# Patient Record
Sex: Female | Born: 1935 | Race: White | Hispanic: No | Smoking: Never smoker
Health system: Southern US, Community
[De-identification: ages and names within clinical notes are randomized; demographics above are authoritative.]

## PROBLEM LIST (undated history)

## (undated) DIAGNOSIS — F0390 Unspecified dementia without behavioral disturbance: Secondary | ICD-10-CM

## (undated) DIAGNOSIS — C801 Malignant (primary) neoplasm, unspecified: Secondary | ICD-10-CM

## (undated) DIAGNOSIS — H9190 Unspecified hearing loss, unspecified ear: Secondary | ICD-10-CM

## (undated) DIAGNOSIS — F419 Anxiety disorder, unspecified: Secondary | ICD-10-CM

## (undated) DIAGNOSIS — I639 Cerebral infarction, unspecified: Secondary | ICD-10-CM

## (undated) DIAGNOSIS — F0391 Unspecified dementia with behavioral disturbance: Secondary | ICD-10-CM

## (undated) DIAGNOSIS — G4700 Insomnia, unspecified: Secondary | ICD-10-CM

## (undated) HISTORY — DX: Unspecified dementia with behavioral disturbance: F03.91

## (undated) HISTORY — PX: NO PAST SURGERIES: SHX2092

## (undated) HISTORY — DX: Insomnia, unspecified: G47.00

## (undated) HISTORY — DX: Unspecified hearing loss, unspecified ear: H91.90

---

## 1997-12-10 ENCOUNTER — Other Ambulatory Visit: Admission: RE | Admit: 1997-12-10 | Discharge: 1997-12-10 | Payer: Self-pay | Admitting: *Deleted

## 1998-09-25 ENCOUNTER — Inpatient Hospital Stay (HOSPITAL_COMMUNITY): Admission: EM | Admit: 1998-09-25 | Discharge: 1998-09-26 | Payer: Self-pay | Admitting: Emergency Medicine

## 1999-01-07 ENCOUNTER — Other Ambulatory Visit: Admission: RE | Admit: 1999-01-07 | Discharge: 1999-01-07 | Payer: Self-pay | Admitting: *Deleted

## 1999-10-27 ENCOUNTER — Encounter: Admission: RE | Admit: 1999-10-27 | Discharge: 1999-10-27 | Payer: Self-pay | Admitting: *Deleted

## 1999-10-27 ENCOUNTER — Encounter: Payer: Self-pay | Admitting: *Deleted

## 2000-01-26 ENCOUNTER — Other Ambulatory Visit: Admission: RE | Admit: 2000-01-26 | Discharge: 2000-01-26 | Payer: Self-pay | Admitting: *Deleted

## 2000-11-06 ENCOUNTER — Encounter: Payer: Self-pay | Admitting: Obstetrics and Gynecology

## 2000-11-06 ENCOUNTER — Encounter: Admission: RE | Admit: 2000-11-06 | Discharge: 2000-11-06 | Payer: Self-pay | Admitting: Obstetrics and Gynecology

## 2001-11-13 ENCOUNTER — Encounter: Payer: Self-pay | Admitting: *Deleted

## 2001-11-13 ENCOUNTER — Encounter: Admission: RE | Admit: 2001-11-13 | Discharge: 2001-11-13 | Payer: Self-pay | Admitting: *Deleted

## 2002-11-18 ENCOUNTER — Encounter: Payer: Self-pay | Admitting: *Deleted

## 2002-11-18 ENCOUNTER — Encounter: Admission: RE | Admit: 2002-11-18 | Discharge: 2002-11-18 | Payer: Self-pay | Admitting: *Deleted

## 2003-12-02 ENCOUNTER — Encounter: Admission: RE | Admit: 2003-12-02 | Discharge: 2003-12-02 | Payer: Self-pay | Admitting: *Deleted

## 2004-07-16 ENCOUNTER — Encounter: Admission: RE | Admit: 2004-07-16 | Discharge: 2004-07-16 | Payer: Self-pay | Admitting: Family Medicine

## 2007-11-25 ENCOUNTER — Emergency Department (HOSPITAL_COMMUNITY): Admission: EM | Admit: 2007-11-25 | Discharge: 2007-11-25 | Payer: Self-pay | Admitting: Emergency Medicine

## 2011-05-30 ENCOUNTER — Encounter (HOSPITAL_COMMUNITY): Payer: Self-pay | Admitting: Emergency Medicine

## 2011-05-30 ENCOUNTER — Emergency Department (HOSPITAL_COMMUNITY)
Admission: EM | Admit: 2011-05-30 | Discharge: 2011-05-30 | Disposition: A | Discharge status: Home / Self Care | Payer: Medicare Other | Attending: Emergency Medicine | Admitting: Emergency Medicine

## 2011-05-30 ENCOUNTER — Other Ambulatory Visit: Payer: Self-pay

## 2011-05-30 ENCOUNTER — Emergency Department (HOSPITAL_COMMUNITY): Payer: Medicare Other

## 2011-05-30 DIAGNOSIS — S01511A Laceration without foreign body of lip, initial encounter: Secondary | ICD-10-CM

## 2011-05-30 DIAGNOSIS — S01501A Unspecified open wound of lip, initial encounter: Secondary | ICD-10-CM | POA: Insufficient documentation

## 2011-05-30 DIAGNOSIS — R220 Localized swelling, mass and lump, head: Secondary | ICD-10-CM | POA: Insufficient documentation

## 2011-05-30 DIAGNOSIS — F0390 Unspecified dementia without behavioral disturbance: Secondary | ICD-10-CM | POA: Insufficient documentation

## 2011-05-30 DIAGNOSIS — Z79899 Other long term (current) drug therapy: Secondary | ICD-10-CM | POA: Insufficient documentation

## 2011-05-30 DIAGNOSIS — W19XXXA Unspecified fall, initial encounter: Secondary | ICD-10-CM | POA: Insufficient documentation

## 2011-05-30 DIAGNOSIS — S022XXA Fracture of nasal bones, initial encounter for closed fracture: Secondary | ICD-10-CM | POA: Insufficient documentation

## 2011-05-30 HISTORY — DX: Unspecified dementia, unspecified severity, without behavioral disturbance, psychotic disturbance, mood disturbance, and anxiety: F03.90

## 2011-05-30 LAB — CBC
HCT: 39.1 % (ref 36.0–46.0)
Hemoglobin: 13.1 g/dL (ref 12.0–15.0)
MCH: 31.7 pg (ref 26.0–34.0)
MCHC: 33.5 g/dL (ref 30.0–36.0)
RDW: 13.7 % (ref 11.5–15.5)

## 2011-05-30 LAB — CARDIAC PANEL(CRET KIN+CKTOT+MB+TROPI): Total CK: 117 U/L (ref 7–177)

## 2011-05-30 LAB — COMPREHENSIVE METABOLIC PANEL
Albumin: 3.5 g/dL (ref 3.5–5.2)
BUN: 15 mg/dL (ref 6–23)
Creatinine, Ser: 0.68 mg/dL (ref 0.50–1.10)
Total Protein: 6.4 g/dL (ref 6.0–8.3)

## 2011-05-30 LAB — URINALYSIS, ROUTINE W REFLEX MICROSCOPIC
Glucose, UA: NEGATIVE mg/dL
Leukocytes, UA: NEGATIVE
Specific Gravity, Urine: 1.024 (ref 1.005–1.030)
pH: 5.5 (ref 5.0–8.0)

## 2011-05-30 LAB — PROTIME-INR
INR: 0.99 (ref 0.00–1.49)
Prothrombin Time: 13.3 seconds (ref 11.6–15.2)

## 2011-05-30 LAB — DIFFERENTIAL
Basophils Relative: 0 % (ref 0–1)
Eosinophils Absolute: 0 10*3/uL (ref 0.0–0.7)
Monocytes Absolute: 0.6 10*3/uL (ref 0.1–1.0)
Monocytes Relative: 6 % (ref 3–12)
Neutrophils Relative %: 83 % — ABNORMAL HIGH (ref 43–77)

## 2011-05-30 LAB — APTT: aPTT: 33 seconds (ref 24–37)

## 2011-05-30 MED ORDER — TRAMADOL HCL 50 MG PO TABS: 50.0000 mg | ORAL_TABLET | Freq: Four times a day (QID) | ORAL | Status: AC | PRN

## 2011-05-30 MED ORDER — BACITRACIN-NEOMYCIN-POLYMYXIN 400-5-5000 EX OINT
TOPICAL_OINTMENT | CUTANEOUS | Status: AC
  Administered 2011-05-30: 1
  Filled 2011-05-30: qty 1

## 2011-05-30 MED ORDER — TETANUS-DIPHTH-ACELL PERTUSSIS 5-2.5-18.5 LF-MCG/0.5 IM SUSP
0.5000 mL | Freq: Once | INTRAMUSCULAR | Status: AC
  Administered 2011-05-30: 0.5 mL via INTRAMUSCULAR
  Filled 2011-05-30: qty 0.5

## 2011-05-30 NOTE — ED Provider Notes (Signed)
Pt was sutured by me. Pt with fall while walking. Laceration to the lip, several abrasions.   LACERATION REPAIR Performed by: Lottie Mussel Authorized by: Jaynie Crumble A Consent: Verbal consent obtained. Risks and benefits: risks, benefits and alternatives were discussed Consent given by: patient Patient identity confirmed: provided demographic data Prepped and Draped in normal sterile fashion Wound explored  Laceration Location: upper lip, stellae lac Laceration Length: 1cm  No Foreign Bodies seen or palpated  Anesthesia: local infiltration  Local anesthetic: lidocaine 2% wo epinephrine  Anesthetic total: 1ml  Irrigation method: syringe Amount of cleaning: standard  Skin closure: vicril rapid  Number of sutures: 3  Technique: 3  Patient tolerance: Patient tolerated the procedure well with no immediate complications.   Lottie Mussel, [place] 05/30/11 2146

## 2011-05-30 NOTE — ED Notes (Signed)
Pt ambulated to restroom with minimal assist.

## 2011-05-30 NOTE — Discharge Instructions (Signed)
Absorbable Suture Repair Absorbable sutures (stitches) hold skin together so you can heal. Keep skin wounds clean and dry for the next 2 to 3 days. Then, you may gently wash your wound and dress it with an antibiotic ointment as recommended. As your wound begins to heal, the sutures are no longer needed, and they typically begin to fall off. This will take 7 to 10 days. After 10 days, if your sutures are loose, you can remove them by wiping with a clean gauze pad or a cotton ball. Do not pull your sutures out. They should wipe away easily. If after 10 days they do not easily wipe away, have your caregiver take them out. Absorbable sutures may be used deep in a wound to help hold it together. If these stitches are below the skin, the body will absorb them completely in 3 to 4 weeks.  You may need a tetanus shot if:  You cannot remember when you had your last tetanus shot.   You have never had a tetanus shot.  If you get a tetanus shot, your arm may swell, get red, and feel warm to the touch. This is common and not a problem. If you need a tetanus shot and you choose not to have one, there is a rare chance of getting tetanus. Sickness from tetanus can be serious. SEEK IMMEDIATE MEDICAL CARE IF:  You have redness in the wound area.   The wound area feels hot to the touch.   You develop swelling in the wound area.   You develop pain.   There is fluid drainage from the wound.  Document Released: 04/21/2004 Document Revised: 03/03/2011 Document Reviewed: 08/03/2010 Exit[employer] Patient Information 2012 [employer]. 

## 2011-05-30 NOTE — ED Notes (Signed)
Pt fell while walking, laceration to lip and nose

## 2011-05-30 NOTE — ED Provider Notes (Signed)
History     CSN: [id number]  Arrival date & time 05/30/11  1813   First MD Initiated Contact with Patient 05/30/11 1922      Chief Complaint  Patient presents with  . Fall    (Consider location/radiation/quality/duration/timing/severity/associated sxs/prior treatment) HPI Pt had unwitnessed fall from standing striking her head sustaining small laceration to her upper lip and abrasion to her nose. Denies LOC, neck pain, CP, weakness, abd pain, N/V. No mental status change  Past Medical History  Diagnosis Date  . Dementia     History reviewed. No pertinent past surgical history.  No family history on file.  History  Substance Use Topics  . Smoking status: Not on file  . Smokeless tobacco: Not on file  . Alcohol Use:     OB History    Grav Para Term Preterm Abortions TAB SAB Ect Mult Living                  Review of Systems  Constitutional: Negative for fever and chills.  HENT: Positive for facial swelling. Negative for neck pain and neck stiffness.   Eyes: Negative for pain and visual disturbance.  Respiratory: Negative for cough, shortness of breath and wheezing.   Cardiovascular: Negative for chest pain and leg swelling.  Gastrointestinal: Negative for nausea, vomiting, abdominal pain and diarrhea.  Musculoskeletal: Negative for back pain and joint swelling.  Skin: Positive for wound. Negative for color change, pallor and rash.  Neurological: Negative for dizziness, syncope, weakness, numbness and headaches.    Allergies  Review of patient's allergies indicates no known allergies.  Home Medications   Current Outpatient Rx  Name Route Sig Dispense Refill  . DONEPEZIL HCL 10 MG PO TABS Oral Take 10 mg by mouth daily.    Marland Kitchen RISPERIDONE 0.25 MG PO TABS Oral Take 0.25 mg by mouth 2 (two) times daily.    . SERTRALINE HCL 100 MG PO TABS Oral Take 100 mg by mouth daily.    . TRAMADOL HCL 50 MG PO TABS Oral Take 1 tablet (50 mg total) by mouth every 6 (six) hours as  needed for pain. 15 tablet 0    BP 127/59  Pulse 78  Temp(Src) 98.3 F (36.8 C) (Oral)  Resp 22  SpO2 100%  Physical Exam  Nursing note and vitals reviewed. Constitutional: She is oriented to person, place, and time. She appears well-developed and well-nourished. No distress.  HENT:  Head: Normocephalic.  Mouth/Throat: Oropharynx is clear and moist.       Abrasions to nose with swelling and tenderness present. Small 1 cm irregular laceration to top lip.   Eyes: EOM are normal. Pupils are equal, round, and reactive to light.  Neck: Normal range of motion. Neck supple.       No post cervical midline tenderness. FROM  Cardiovascular: Normal rate and regular rhythm.   Pulmonary/Chest: Effort normal and breath sounds normal. No respiratory distress. She has no wheezes. She has no rales.  Abdominal: Soft. Bowel sounds are normal. There is no tenderness. There is no rebound and no guarding.  Musculoskeletal: Normal range of motion. She exhibits no edema and no tenderness.  Neurological: She is alert and oriented to person, place, and time.       5/5 motor, sensation intact  Skin: Skin is warm and dry. No rash noted. No erythema.  Psychiatric: She has a normal mood and affect. Her behavior is normal.    ED Course  Procedures (including critical care time)  Labs Reviewed  DIFFERENTIAL - Abnormal; Notable for the following:    Neutrophils Relative 83 (*)    Lymphocytes Relative 11 (*)    All other components within normal limits  COMPREHENSIVE METABOLIC PANEL - Abnormal; Notable for the following:    Glucose, Bld 122 (*)    Total Bilirubin 0.2 (*)    GFR calc non Af Amer 78 (*)    All other components within normal limits  CARDIAC PANEL(CRET KIN+CKTOT+MB+TROPI) - Abnormal; Notable for the following:    CK, MB 5.7 (*)    Relative Index 4.9 (*)    All other components within normal limits  CBC  URINALYSIS, ROUTINE W REFLEX MICROSCOPIC  PROTIME-INR  APTT  LAB REPORT - SCANNED    Ct Head Wo Contrast  05/30/2011  *RADIOLOGY REPORT*  Clinical Data:  Fall.  Facial injuries.  CT HEAD WITHOUT CONTRAST CT MAXILLOFACIAL WITHOUT CONTRAST  Technique:  Multidetector CT imaging of the head and maxillofacial structures were performed using the standard protocol without intravenous contrast. Multiplanar CT image reconstructions of the maxillofacial structures were also generated.  Comparison:   None.  CT HEAD  Findings: There is no evidence for acute hemorrhage, hydrocephalus, mass lesion, or abnormal extra-axial fluid collection.  No definite CT evidence for acute infarction.  Diffuse loss of parenchymal volume is consistent with atrophy. Patchy low attenuation in the deep hemispheric and periventricular white matter is nonspecific, but likely reflects chronic microvascular ischemic demyelination.  The visualized paranasal sinuses and mastoid air cells are clear. No evidence for skull fracture.  IMPRESSION: No acute intracranial abnormality.  CT MAXILLOFACIAL  Findings:   The mandible is intact.  The temporomandibular joints are located bilaterally.  There are degenerative changes in the temporomandibular joints bilaterally.  Minimally displaced nasal bone fractures are age indeterminate.  The zygomatic arches are intact.  There is no medial or inferior orbital wall fracture.  No evidence for maxillary sinus fracture.  The globes are symmetric in size and both retaining spherical shape.  Intraorbital fat is preserved bilaterally.  IMPRESSION: Minimally less for displaced nasal bone fractures are age indeterminate in appearance.  Otherwise no facial bone fracture is evident.  Original Report Authenticated By: ERIC A. MANSELL, M.D.   Ct Maxillofacial Wo Cm  05/30/2011  *RADIOLOGY REPORT*  Clinical Data:  Fall.  Facial injuries.  CT HEAD WITHOUT CONTRAST CT MAXILLOFACIAL WITHOUT CONTRAST  Technique:  Multidetector CT imaging of the head and maxillofacial structures were performed using the standard  protocol without intravenous contrast. Multiplanar CT image reconstructions of the maxillofacial structures were also generated.  Comparison:   None.  CT HEAD  Findings: There is no evidence for acute hemorrhage, hydrocephalus, mass lesion, or abnormal extra-axial fluid collection.  No definite CT evidence for acute infarction.  Diffuse loss of parenchymal volume is consistent with atrophy. Patchy low attenuation in the deep hemispheric and periventricular white matter is nonspecific, but likely reflects chronic microvascular ischemic demyelination.  The visualized paranasal sinuses and mastoid air cells are clear. No evidence for skull fracture.  IMPRESSION: No acute intracranial abnormality.  CT MAXILLOFACIAL  Findings:   The mandible is intact.  The temporomandibular joints are located bilaterally.  There are degenerative changes in the temporomandibular joints bilaterally.  Minimally displaced nasal bone fractures are age indeterminate.  The zygomatic arches are intact.  There is no medial or inferior orbital wall fracture.  No evidence for maxillary sinus fracture.  The globes are symmetric in size and both retaining spherical shape.  Intraorbital fat is preserved bilaterally.  IMPRESSION: Minimally less for displaced nasal bone fractures are age indeterminate in appearance.  Otherwise no facial bone fracture is evident.  Original Report Authenticated By: ERIC A. MANSELL, M.D.     1. Fall   2. Nasal fracture   3. Lip laceration       MDM          Loren Racer, MD 06/01/11 [phone]
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
## 2011-05-30 NOTE — ED Provider Notes (Signed)
Medical screening examination/treatment/procedure(s) were conducted as a shared visit with non-physician practitioner(s) and myself.  I personally evaluated the patient during the encounter   Loren Racer, MD 05/30/11 2311

## 2012-06-19 ENCOUNTER — Telehealth: Payer: Self-pay

## 2012-06-19 MED ORDER — RISPERIDONE 1 MG PO TABS: ORAL_TABLET | ORAL | Status: DC

## 2012-06-19 NOTE — Telephone Encounter (Signed)
Patient daughter in law calling Silvio Pate). Risperdal 0.5 mg is not helping. Dr.Yan wanted  me to call back if RX did not help. Shelia states we need some help. Patient is still pacing a lot and she is trying to crawl out the windows to get out side. We have a RN moving in the basement soon at her house.

## 2012-06-19 NOTE — Telephone Encounter (Signed)
I have called her daughter in law, she was given risperidal 0.5mg  bid, did not help at all.  She walks around the house like a lion in a cage, climbed out of the [place]own. I will increase her risperidal to 1mg  2 tabs po bid.

## 2012-06-25 ENCOUNTER — Encounter: Payer: Self-pay | Admitting: Neurology

## 2012-06-25 ENCOUNTER — Telehealth: Payer: Self-pay | Admitting: Neurology

## 2012-06-25 ENCOUNTER — Ambulatory Visit (INDEPENDENT_AMBULATORY_CARE_PROVIDER_SITE_OTHER): Payer: Medicare Other | Admitting: Neurology

## 2012-06-25 VITALS — BP 141/75 | HR 112 | Ht 63.0 in | Wt 112.0 lb

## 2012-06-25 DIAGNOSIS — H9193 Unspecified hearing loss, bilateral: Secondary | ICD-10-CM

## 2012-06-25 DIAGNOSIS — H9190 Unspecified hearing loss, unspecified ear: Secondary | ICD-10-CM

## 2012-06-25 DIAGNOSIS — F0391 Unspecified dementia with behavioral disturbance: Secondary | ICD-10-CM

## 2012-06-25 DIAGNOSIS — G4700 Insomnia, unspecified: Secondary | ICD-10-CM

## 2012-06-25 DIAGNOSIS — F03918 Unspecified dementia, unspecified severity, with other behavioral disturbance: Secondary | ICD-10-CM

## 2012-06-25 HISTORY — DX: Unspecified dementia, unspecified severity, with other behavioral disturbance: F03.918

## 2012-06-25 HISTORY — DX: Unspecified dementia with behavioral disturbance: F03.91

## 2012-06-25 HISTORY — DX: Unspecified hearing loss, unspecified ear: H91.90

## 2012-06-25 MED ORDER — CLONAZEPAM 0.5 MG PO TABS: 0.5000 mg | ORAL_TABLET | Freq: Two times a day (BID) | ORAL | Status: DC | PRN

## 2012-06-25 MED ORDER — RISPERIDONE 1 MG PO TABS: ORAL_TABLET | ORAL | Status: DC
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
## 2012-06-25 NOTE — Telephone Encounter (Signed)
Christina Summers, called re: pt.  (she and grandaughter are caregivers).  Family wants to keep pt home.  Pt continues with agitation, wandering, when in house trying to get out, used commode lid to bang on door trying to get out, family afraid pt will do to window and hurt herself.  The risperdal not working (giving 0.5mg  tabs 2 po bid).  I relayed that last note ([facility] EMR) 0.25 mg 2 tabs po bid.  She was sure about the dose.  She feels like the pt needs something to calm her down, keep her sedated, also med to help her sleep.  Check pt into hospital until meds regulated.  Please call.  [phone], uses [facility].

## 2012-06-25 NOTE — Telephone Encounter (Signed)
I have called [phone], she has bad agitation, she took the top of commode, banging, beating the door,  Daughter in law wants her to live in the house, I advise them to bring her in today. With all her medication list.  Christina Summers, give her an appt for today

## 2012-06-28 ENCOUNTER — Telehealth: Payer: Self-pay

## 2012-06-28 NOTE — Telephone Encounter (Signed)
Patient daughter in law calling Velna Hatchet states she went to pharmacy to pick up clonazepam and rx was not there can you call in so I can pick up today.

## 2012-06-28 NOTE — Telephone Encounter (Signed)
Christina Summers please let patient's known, she needs to come back to get a hardcopy of clonazepam to get it refilled

## 2012-07-02 ENCOUNTER — Telehealth: Payer: Self-pay

## 2012-07-02 MED ORDER — QUETIAPINE FUMARATE 200 MG PO TABS: 200.0000 mg | ORAL_TABLET | Freq: Every day | ORAL | Status: DC

## 2012-07-02 NOTE — Telephone Encounter (Signed)
I have called her daughter in law, she is taking risperidral 2mg  bid, clonazepam 0.5mg  qhs was helping her sleeping, but she still agitated during day time. I have called in seroquel 200mg  1/2 in am, one in pm. Tapering off risperidol

## 2012-07-02 NOTE — Telephone Encounter (Signed)
Daughter in [place] states she cant take any more needs a rx to call in she is about to loose her mind.

## 2012-07-06 NOTE — Telephone Encounter (Signed)
This has been done.

## 2012-07-15 ENCOUNTER — Inpatient Hospital Stay (HOSPITAL_COMMUNITY): Payer: Medicare Other

## 2012-07-15 ENCOUNTER — Encounter (HOSPITAL_COMMUNITY): Payer: Self-pay

## 2012-07-15 ENCOUNTER — Inpatient Hospital Stay (HOSPITAL_COMMUNITY)
Admission: EM | Admit: 2012-07-15 | Discharge: 2012-07-18 | DRG: 065 | Disposition: A | Discharge status: Home / Self Care | Payer: Medicare Other | Attending: Internal Medicine | Admitting: Internal Medicine

## 2012-07-15 ENCOUNTER — Emergency Department (HOSPITAL_COMMUNITY): Payer: Medicare Other

## 2012-07-15 DIAGNOSIS — F0390 Unspecified dementia without behavioral disturbance: Secondary | ICD-10-CM | POA: Diagnosis present

## 2012-07-15 DIAGNOSIS — H9193 Unspecified hearing loss, bilateral: Secondary | ICD-10-CM

## 2012-07-15 DIAGNOSIS — H9190 Unspecified hearing loss, unspecified ear: Secondary | ICD-10-CM | POA: Diagnosis present

## 2012-07-15 DIAGNOSIS — F03918 Unspecified dementia, unspecified severity, with other behavioral disturbance: Secondary | ICD-10-CM

## 2012-07-15 DIAGNOSIS — F0391 Unspecified dementia with behavioral disturbance: Secondary | ICD-10-CM | POA: Diagnosis present

## 2012-07-15 DIAGNOSIS — I639 Cerebral infarction, unspecified: Secondary | ICD-10-CM

## 2012-07-15 DIAGNOSIS — G4700 Insomnia, unspecified: Secondary | ICD-10-CM

## 2012-07-15 DIAGNOSIS — G8190 Hemiplegia, unspecified affecting unspecified side: Secondary | ICD-10-CM | POA: Diagnosis present

## 2012-07-15 DIAGNOSIS — E785 Hyperlipidemia, unspecified: Secondary | ICD-10-CM

## 2012-07-15 DIAGNOSIS — I6350 Cerebral infarction due to unspecified occlusion or stenosis of unspecified cerebral artery: Principal | ICD-10-CM | POA: Diagnosis present

## 2012-07-15 LAB — CBC
HCT: 43.7 % (ref 36.0–46.0)
MCHC: 34.6 g/dL (ref 30.0–36.0)
MCV: 93.4 fL (ref 78.0–100.0)
RDW: 13 % (ref 11.5–15.5)
WBC: 10 10*3/uL (ref 4.0–10.5)

## 2012-07-15 LAB — BASIC METABOLIC PANEL
BUN: 20 mg/dL (ref 6–23)
Chloride: 102 mEq/L (ref 96–112)
Creatinine, Ser: 0.89 mg/dL (ref 0.50–1.10)
GFR calc Af Amer: 67 mL/min — ABNORMAL LOW (ref >90)

## 2012-07-15 LAB — GLUCOSE, CAPILLARY: Glucose-Capillary: 123 mg/dL — ABNORMAL HIGH (ref 70–99)

## 2012-07-15 LAB — URINALYSIS, ROUTINE W REFLEX MICROSCOPIC
Ketones, ur: 15 mg/dL — AB
Nitrite: NEGATIVE
pH: 6 (ref 5.0–8.0)

## 2012-07-15 MED ORDER — CLONAZEPAM 0.5 MG PO TABS
0.5000 mg | ORAL_TABLET | Freq: Two times a day (BID) | ORAL | Status: DC | PRN
  Administered 2012-07-15: 0.5 mg via ORAL
  Filled 2012-07-15: qty 1

## 2012-07-15 MED ORDER — INSULIN ASPART 100 UNIT/ML ~~LOC~~ SOLN: 0.0000 [IU] | Freq: Every day | SUBCUTANEOUS | Status: DC

## 2012-07-15 MED ORDER — ASPIRIN 300 MG RE SUPP
300.0000 mg | Freq: Every day | RECTAL | Status: DC
  Filled 2012-07-15 (×4): qty 1

## 2012-07-15 MED ORDER — HEPARIN SODIUM (PORCINE) 5000 UNIT/ML IJ SOLN
5000.0000 [IU] | Freq: Three times a day (TID) | INTRAMUSCULAR | Status: DC
  Administered 2012-07-15 – 2012-07-18 (×6): 5000 [IU] via SUBCUTANEOUS
  Filled 2012-07-15 (×11): qty 1

## 2012-07-15 MED ORDER — HALOPERIDOL LACTATE 5 MG/ML IJ SOLN: 1.0000 mg | Freq: Four times a day (QID) | INTRAMUSCULAR | Status: DC | PRN

## 2012-07-15 MED ORDER — ONDANSETRON HCL 4 MG/2ML IJ SOLN: 4.0000 mg | Freq: Four times a day (QID) | INTRAMUSCULAR | Status: DC | PRN

## 2012-07-15 MED ORDER — QUETIAPINE FUMARATE 100 MG PO TABS: 100.0000 mg | ORAL_TABLET | Freq: Two times a day (BID) | ORAL | Status: DC

## 2012-07-15 MED ORDER — INSULIN ASPART 100 UNIT/ML ~~LOC~~ SOLN: 0.0000 [IU] | Freq: Three times a day (TID) | SUBCUTANEOUS | Status: DC

## 2012-07-15 MED ORDER — INSULIN ASPART 100 UNIT/ML ~~LOC~~ SOLN
3.0000 [IU] | Freq: Three times a day (TID) | SUBCUTANEOUS | Status: DC
  Administered 2012-07-16 – 2012-07-18 (×3): 3 [IU] via SUBCUTANEOUS

## 2012-07-15 MED ORDER — QUETIAPINE FUMARATE 200 MG PO TABS
200.0000 mg | ORAL_TABLET | Freq: Every day | ORAL | Status: DC
  Administered 2012-07-15 – 2012-07-17 (×3): 200 mg via ORAL
  Filled 2012-07-15 (×4): qty 1

## 2012-07-15 MED ORDER — ASPIRIN 325 MG PO TABS
325.0000 mg | ORAL_TABLET | Freq: Every day | ORAL | Status: DC
  Administered 2012-07-15 – 2012-07-18 (×4): 325 mg via ORAL
  Filled 2012-07-15 (×4): qty 1

## 2012-07-15 MED ORDER — SENNOSIDES-DOCUSATE SODIUM 8.6-50 MG PO TABS: 1.0000 | ORAL_TABLET | Freq: Every evening | ORAL | Status: DC | PRN

## 2012-07-15 MED ORDER — DONEPEZIL HCL 10 MG PO TABS
10.0000 mg | ORAL_TABLET | Freq: Every day | ORAL | Status: DC
  Administered 2012-07-16 – 2012-07-18 (×3): 10 mg via ORAL
  Filled 2012-07-15 (×3): qty 1

## 2012-07-15 MED ORDER — QUETIAPINE FUMARATE 100 MG PO TABS
100.0000 mg | ORAL_TABLET | Freq: Every day | ORAL | Status: DC
  Administered 2012-07-15 – 2012-07-18 (×4): 100 mg via ORAL
  Filled 2012-07-15 (×4): qty 1

## 2012-07-15 NOTE — ED Notes (Signed)
Pt's son bring pt in POV.  He reports that pt lives with his daughter and her boyfriend.  The boyfriend is an [employer] fundraiser.  Pt's son reports that the boyfriend did a stroke screen and felt pt had stroke like symptoms.  Unable to clearly obtain what the symptoms were.  The son does not seem to have much information.  He states that maybe the pt's tongue was not normal.  Maybe pt had slurred speech.  Pt has hx of dementia.  Pt's son reports that pt's speech seems normal at this time.

## 2012-07-15 NOTE — ED Notes (Signed)
Pt trying to pull IV.  Wrapped with Kerlix.

## 2012-07-15 NOTE — Consult Note (Signed)
Reason for Consult: Rule out stroke Referring Physician: Dr. Karma Ganja  CC: Small right frontal lobe CVA  HPI: Christina Summers is an 79 y.o. female who has been living in her own home with support from her granddaughter. The patient has a history of dementia and often exhibits abnormal behavior according to her son. Sometime early this morning the granddaughter's boyfriend who apparently is an Charity fundraiser noted that the patient appeared to have some left upper extremity weakness. He did a brief exam and recommended that the patient be evaluated in the emergency department. The patient was brought to [facility] where a CT scan of the head showed a small area of acute to subacute infarction in the right frontal lobe. The patient was also noted to have atrophy and chronic microvascular disease. TPA was not considered since the time of onset of symptoms was unknown and the patient has little in the way of deficits. The patient's son who accompanied the patient to the hospital today feels that the patient will require nursing home placement. The plan at this time is to admit the patient for an MRI and further workup.  Past Medical History  Diagnosis Date  . Dementia   . Insomnia, unspecified   . Dementia with behavioral disturbance 06/25/2012  . Hard of hearing 06/25/2012    History reviewed. No pertinent past surgical history.  Family History  Problem Relation Age of Onset  . Other Mother   . Other Father   . Heart attack Father     Social History:  reports that she has never smoked. She does not have any smokeless tobacco history on file. She reports that she does not drink alcohol or use illicit drugs.  No Known Allergies  Medications:  No current facility-administered medications for this encounter.   Current Outpatient Prescriptions  Medication Sig Dispense Refill  . clonazePAM (KLONOPIN) 0.5 MG tablet Take 0.5 mg by mouth 2 (two) times daily as needed for anxiety.      . donepezil  (ARICEPT) 10 MG tablet Take 10 mg by mouth daily.      . QUEtiapine (SEROQUEL) 200 MG tablet Take 100-200 mg by mouth 2 (two) times daily. Takes 100mg  every morning and takes 200mg  every evening.         ROS: Unobtainable secondary to severe dementia.  Physical Examination: Blood pressure 135/76, pulse 95, temperature 98.9 F (37.2 C), temperature source Oral, resp. rate 16, SpO2 96.00%.  General - pleasantly confused 79 year old female who is anxious to return home. Heart - Regular rate and rhythm - no murmer Lungs - Clear to auscultation Abdomen - Soft - non tender Extremities - Distal pulses intact - no edema Skin - Warm and dry. Multiple senile keratosis type lesions.  Mental Status: The patient is alert but disoriented. She was able to identify her son who was in her room. The patient could not tell me her date of birth, the current date, or where she was.Marland Kitchen  Speech fluent without evidence of aphasia.  Able to follow 3 step commands with cueing Cranial Nerves: II: Discs not visualized; Visual fields grossly normal, pupils equal, round, reactive to light and accommodation III,IV, VI: ptosis not present, extra-ocular motions intact bilaterally V,VII: Slight left facial droop, facial light touch sensation normal bilaterally VIII: hearing normal bilaterally IX,X: gag reflex present XI: bilateral shoulder shrug XII: midline tongue extension Motor: Right : Upper extremity   5/5    Left:     Upper extremity   4/5  Lower extremity   5/5     Lower extremity   5/5 Tone and bulk:normal tone throughout; no atrophy noted Sensory: light touch intact throughout, bilaterally Deep Tendon Reflexes: 1+ and symmetric throughout Plantars: Right: downgoing   Left: downgoing Cerebellar: normal finger-to-nose -the patient had difficulty with rapid alternating movements and heel-to-shin movements. Gait: The patient was not ambulated. CV: pulses palpable throughout   Laboratory Studies:   Basic  Metabolic Panel:  Recent Labs Lab 07/15/12 1234  NA 141  K 4.2  CL 102  CO2 30  GLUCOSE 100*  BUN 20  CREATININE 0.89  CALCIUM 9.4    Liver Function Tests: No results found for this basename: AST, ALT, ALKPHOS, BILITOT, PROT, ALBUMIN,  in the last 168 hours No results found for this basename: LIPASE, AMYLASE,  in the last 168 hours No results found for this basename: AMMONIA,  in the last 168 hours  CBC:  Recent Labs Lab 07/15/12 1234  WBC 10.0  HGB 15.1*  HCT 43.7  MCV 93.4  PLT 203    Cardiac Enzymes: No results found for this basename: CKTOTAL, CKMB, CKMBINDEX, TROPONINI,  in the last 168 hours  BNP: No components found with this basename: POCBNP,   CBG: No results found for this basename: GLUCAP,  in the last 168 hours  Microbiology: No results found for this or any previous visit.  Coagulation Studies: No results found for this basename: LABPROT, INR,  in the last 72 hours  Urinalysis:  Recent Labs Lab 07/15/12 1342  COLORURINE AMBER*  LABSPEC 1.026  PHURINE 6.0  GLUCOSEU NEGATIVE  HGBUR NEGATIVE  BILIRUBINUR SMALL*  KETONESUR 15*  PROTEINUR NEGATIVE  UROBILINOGEN 1.0  NITRITE NEGATIVE  LEUKOCYTESUR NEGATIVE    Lipid Panel:  No results found for this basename: chol, trig, hdl, cholhdl, vldl, ldlcalc    HgbA1C:  No results found for this basename: HGBA1C    Urine Drug Screen:   No results found for this basename: labopia, cocainscrnur, labbenz, amphetmu, thcu, labbarb    Alcohol Level: No results found for this basename: ETH,  in the last 168 hours  Other results: EKG: Sinus tachycardia rate 106 beats per minute  Imaging:  Ct Head Wo Contrast 07/15/12 Small area of acute to subacute infarction in the right frontal lobe.  Atrophy, chronic microvascular disease.     Assessment/Plan: [name] is a pleasantly cofused 79 year old female with severe dementia who was being cared for by her granddaughter prior to admission. This  morning, 07/15/2012, the patient was felt to have new onset of left-sided weakness. A CT scan in the emergency department was consistent with a small area of acute to subacute infarction in the right frontal lobe.The patient is to be admitted for further evaluation. An MRI is planned as well as a full stroke workup. The patient was not on antiplatelet therapy prior to admission. Aspirin 81 mg daily will be considered.   Delton See PA-C [facility] Pager [phone] 07/15/2012, 4:07 PM  I have seen and evaluated the patient. I have reviewed the above note and made appropriate changes. On my exam, I do feel that she has some left arm and left face weakness. I suspect that she did have a new infarct today, will proceed with stroke workup  1. HgbA1c, fasting lipid panel 2. MRI, MRA  of the brain without contrast 3. Frequent neuro checks 4. Echocardiogram 5. Carotid dopplers 6. Prophylactic therapy-Antiplatelet med: Aspirin - dose 81mg .  7. Risk factor modification  8. Telemetry monitoring 9. PT consult, OT consult, Speech consult   Ritta Slot, MD [facility] [phone]  If 7pm- 7am, please page neurology on call at [phone].

## 2012-07-15 NOTE — H&P (Signed)
[facility] History and Physical  ALAILA PILLARD OZH:[id number] DOB: [birth year] DOA: 07/15/2012  Referring physician: Dr. Karma Ganja PCP: No primary provider on file.  Specialists: none  Chief Complaint: Facial droop, slurred speech  HPI: Christina Summers is a 79 y.o. female  Past medical history of advanced dementia, not an aspirin comes in for facial droop and slurred speech that started 4 hours prior to arrival to the emergency room. As per son's as patient cannot provide a good history she was doing fine when she went to sleep when he saw this morning at around 7:30 AM she was slurring her speech and dropping stuff. Her daughters her boyfriend who is an Charity fundraiser did FACE test and she will have a left-sided facial droop so they decided to bring her here to the emergency room.  In the emergency room: CT scan of the head was done that showed Small area of acute to subacute infarction in the right frontal  lobe. We were asked to admit and further evaluate  Review of Systems: The patient denies anorexia, fever, weight loss,, vision loss, decreased hearing, hoarseness, chest pain, syncope, dyspnea on exertion, peripheral edema, balance deficits, hemoptysis, abdominal pain, melena, hematochezia, severe indigestion/heartburn, hematuria, incontinence, genital sores, muscle weakness, suspicious skin lesions, transient blindness, difficulty walking, depression, unusual weight change, abnormal bleeding, enlarged lymph nodes, angioedema, and breast masses.    Past Medical History  Diagnosis Date  . Dementia   . Insomnia, unspecified   . Dementia with behavioral disturbance 06/25/2012  . Hard of hearing 06/25/2012   History reviewed. No pertinent past surgical history. Social History:  reports that she has never smoked. She does not have any smokeless tobacco history on file. She reports that she does not drink alcohol or use illicit drugs. Cannot perform any of her ADLs  No Known Allergies  Family  History  Problem Relation Age of Onset  . Other Mother   . Other Father   . Heart attack Father      Prior to Admission medications   Medication Sig Start Date End Date Taking? Authorizing Provider  clonazePAM (KLONOPIN) 0.5 MG tablet Take 0.5 mg by mouth 2 (two) times daily as needed for anxiety.   Yes Historical Provider, MD  donepezil (ARICEPT) 10 MG tablet Take 10 mg by mouth daily.   Yes Historical Provider, MD  QUEtiapine (SEROQUEL) 200 MG tablet Take 100-200 mg by mouth 2 (two) times daily. Takes 100mg  every morning and takes 200mg  every evening.   Yes Historical Provider, MD   Physical Exam: Filed Vitals:   07/15/12 1301  BP: 135/76  Pulse: 95  Temp: 98.9 F (37.2 C)  TempSrc: Oral  Resp: 16  SpO2: 96%    BP 135/76  Pulse 95  Temp(Src) 98.9 F (37.2 C) (Oral)  Resp 16  SpO2 96%  General Appearance:    Alert, cooperative, no distress, cachectic appearing   Head:    Normocephalic, without obvious abnormality, atraumatic           Throat:   Lips, mucosa, and tongue normal; teeth and gums normal  Neck:   Supple, symmetrical, trachea midline, no adenopathy;    thyroid:  no enlargement/tenderness/nodules; no carotid   bruit or JVD  Back:     Symmetric, no curvature, ROM normal, no CVA tenderness  Lungs:     Clear to auscultation bilaterally, respirations unlabored  Chest Wall:    No tenderness or deformity   Heart:    Regular rate and rhythm,  S1 and S2 normal, no murmur, rub   or gallop     Abdomen:     Soft, non-tender, bowel sounds active all four quadrants,    no masses, no organomegaly        Extremities:   Extremities normal, atraumatic, no cyanosis or edema  Pulses:   2+ and symmetric all extremities  Skin:   Skin color, texture, turgor normal, no rashes or lesions  Lymph nodes:   Cervical, supraclavicular, and axillary nodes normal  Neurologic:   she is alert and oriented x1, she has a fluent and coherent for language. Extraocular movements are intact,  pupils equally round and reactive to light and accommodation, she has mild facial droop on the left side. Cranial nerves 10,11 and 12 are intact muscle strength is 5 over 5 on the right upper and lower extremity, 4/5 on the left and upper extremity. Finger-to-nose is normal, and sensation is intact. Toes are downgoing bilaterally     Labs on Admission:  Basic Metabolic Panel:  Recent Labs Lab 07/15/12 1234  NA 141  K 4.2  CL 102  CO2 30  GLUCOSE 100*  BUN 20  CREATININE 0.89  CALCIUM 9.4   Liver Function Tests: No results found for this basename: AST, ALT, ALKPHOS, BILITOT, PROT, ALBUMIN,  in the last 168 hours No results found for this basename: LIPASE, AMYLASE,  in the last 168 hours No results found for this basename: AMMONIA,  in the last 168 hours CBC:  Recent Labs Lab 07/15/12 1234  WBC 10.0  HGB 15.1*  HCT 43.7  MCV 93.4  PLT 203   Cardiac Enzymes: No results found for this basename: CKTOTAL, CKMB, CKMBINDEX, TROPONINI,  in the last 168 hours  BNP (last 3 results) No results found for this basename: PROBNP,  in the last 8760 hours CBG: No results found for this basename: GLUCAP,  in the last 168 hours  Radiological Exams on Admission: Ct Head Wo Contrast  07/15/2012  *RADIOLOGY REPORT*  Clinical Data: Stroke symptoms.  CT HEAD WITHOUT CONTRAST  Technique:  Contiguous axial images were obtained from the base of the skull through the vertex without contrast.  Comparison: 05/30/2011  Findings: There is a small ill-defined low density area in the right frontal lobe concerning for acute to subacute infarction. The There is atrophy and chronic small vessel disease changes.  No hemorrhage.  No hydrocephalus.  No mass lesion.  No acute calvarial abnormality.  Visualized paranasal sinuses and mastoids clear.  Orbital soft tissues unremarkable.  IMPRESSION: Small area of acute to subacute infarction in the right frontal lobe.  Atrophy, chronic microvascular disease.   Original  Report Authenticated By: Charlett Nose, M.D.     EKG: Independently reviewed. Sinus tachycardia with T wave inversions in AV L. V5 and V6 most likely early re\re pole changes. And LVH by aVL criteria  Assessment/Plan  CVA (cerebral infarction): HgbA1c, fasting lipid panel  MRI, MRA we did see her stroke on her CT scan, I will hold on on the MRI. PT consult, OT consult, Speech consult  Echocardiogram  Carotid dopplers  Prophylactic therapy-Antiplatelet med: Aspirin - dose 325 mg PO daily  risk factor modification  Cardiac Monitoring  Neurochecks q4h  Keep MAP 70    Dementia: - Continue current home medications. - At [facility] when necessary for agitation, get an EKG in the morning to measure QTC.  Neurology has been consulted  Code Status: Full code Family Communication: son Disposition Plan: SNF  Time  spent: 80 minutes  Marinda Elk [facility] Pager [phone]  If 7PM-7AM, please contact night-coverage [email or web address] Password [facility] 07/15/2012, 3:55 PM

## 2012-07-15 NOTE — ED Provider Notes (Signed)
History     CSN: [id number]  Arrival date & time 07/15/12  1058   First MD Initiated Contact with Patient 07/15/12 1135      Chief Complaint  Patient presents with  . Stroke Symptoms    (Consider location/radiation/quality/duration/timing/severity/associated sxs/prior treatment) HPI A LEVEL 5 CAVEAT PERTAINS DUE TO DEMENTIA Pt presenting with concern for slurred speech this morning which has resolved.  Also concern for possible weakness in left thumb.  Pt is here with her son who does not live with her and hx is obtained from him third hand.  He is not sure exactly of symptoms.  He states his daughter/pts granddaughter lives with patient and her boyfriend is a Engineer, civil (consulting) and became concerned that she has had a stroke.  Pt is not able to contribute to the history  Past Medical History  Diagnosis Date  . Dementia   . Insomnia, unspecified   . Dementia with behavioral disturbance 06/25/2012  . Hard of hearing 06/25/2012    History reviewed. No pertinent past surgical history.  Family History  Problem Relation Age of Onset  . Other Mother   . Other Father   . Heart attack Father     History  Substance Use Topics  . Smoking status: Never Smoker   . Smokeless tobacco: Not on file  . Alcohol Use: No    OB History   Grav Para Term Preterm Abortions TAB SAB Ect Mult Living                  Review of Systems UNABLE TO OBTAIN ROS DUE TO LEVEL 5 CAVEAT  Allergies  Review of patient's allergies indicates no known allergies.  Home Medications   No current outpatient prescriptions on file.  BP 130/65  Pulse 91  Temp(Src) 98.5 F (36.9 C) (Oral)  Resp 18  Ht 5\' 3"  (1.6 m)  Wt 110 lb 10.7 oz (50.2 kg)  BMI 19.61 kg/m2  SpO2 99% Vitals reviewed Physical Exam Physical Examination: General appearance - alert, well appearing, and in no distress Mental status - alert, oriented to person only- this is baseline per son Eyes - pupils equal and reactive, extraocular eye  movements intact Mouth - mucous membranes moist, pharynx normal without lesions Neck - supple, no significant adenopathy Chest - clear to auscultation, no wheezes, rales or rhonchi, symmetric air entry Heart - normal rate, regular rhythm, normal S1, S2, no murmurs, rubs, clicks or gallops Abdomen - soft, nontender, nondistended, no masses or organomegaly Neurological - alert, oriented, normal speech, following commands well, cranial nerves 2-12 tested and intact, strength 5/5 in extremities x 4, ? Subjective weakness of left thumb- pt able to give thumbs up bilaterally, sensation intact in extremiteis x 4 Extremities - peripheral pulses normal, no pedal edema, no clubbing or cyanosis Skin - normal coloration and turgor, no rashes  ED Course  Procedures (including critical care time)   Date: 07/15/2012  Rate: 106  Rhythm: sinus tachycardia  QRS Axis: normal  Intervals: normal  ST/T Wave abnormalities: t wave inversions in inferior and lateral leads  Conduction Disutrbances:none  Narrative Interpretation:   Old EKG Reviewed: no significant changes compared to prior ekg of 05/30/11 except rate faster   12:46 PM Son has a lot of concerns about the cleanliness of the ED room. He states that he works for a union and that "this hospital is obviously not unionized or the room would be cleaner."  He is concerned about dust on the floor and a  scrap piece of paper that he has also noted on the floor.  I have assured him that environmental services cleans the rooms regularly.  Also have asked nursing to call environmental services to see if they can sweep this room.    2:35 PM discussed CT results and patient presentation with neurology, they will see patient in the ED  3:07 PM updated patient and family about results and plan.  D/w Dr. David Stall who will admit the patient.  Family is not sure who her pmd is - she does have a pmd on [address]- possibly Dr. Susann Givens  Labs Reviewed  CBC -  Abnormal; Notable for the following:    Hemoglobin 15.1 (*)    All other components within normal limits  BASIC METABOLIC PANEL - Abnormal; Notable for the following:    Glucose, Bld 100 (*)    GFR calc non Af Amer 57 (*)    GFR calc Af Amer 67 (*)    All other components within normal limits  URINALYSIS, ROUTINE W REFLEX MICROSCOPIC - Abnormal; Notable for the following:    Color, Urine AMBER (*)    Bilirubin Urine SMALL (*)    Ketones, ur 15 (*)    All other components within normal limits  LIPID PANEL - Abnormal; Notable for the following:    Triglycerides 160 (*)    HDL 39 (*)    LDL Cholesterol 101 (*)    All other components within normal limits  GLUCOSE, CAPILLARY - Abnormal; Notable for the following:    Glucose-Capillary 123 (*)    All other components within normal limits  GLUCOSE, CAPILLARY - Abnormal; Notable for the following:    Glucose-Capillary 102 (*)    All other components within normal limits  HEMOGLOBIN A1C   Dg Chest 2 View  07/16/2012  *RADIOLOGY REPORT*  Clinical Data: Stroke.  CHEST - 2 VIEW  Comparison: PA and lateral chest 11/25/2007.  Findings: Lungs are clear.  Lung volumes are somewhat low.  Heart size is normal.  No pneumothorax or pleural effusion.  Mild, remote L1 superior endplate compression fracture is again seen.  IMPRESSION: No acute disease.   Original Report Authenticated By: Holley Dexter, M.D.    Ct Head Wo Contrast  07/15/2012  *RADIOLOGY REPORT*  Clinical Data: Stroke symptoms.  CT HEAD WITHOUT CONTRAST  Technique:  Contiguous axial images were obtained from the base of the skull through the vertex without contrast.  Comparison: 05/30/2011  Findings: There is a small ill-defined low density area in the right frontal lobe concerning for acute to subacute infarction. The There is atrophy and chronic small vessel disease changes.  No hemorrhage.  No hydrocephalus.  No mass lesion.  No acute calvarial abnormality.  Visualized paranasal sinuses  and mastoids clear.  Orbital soft tissues unremarkable.  IMPRESSION: Small area of acute to subacute infarction in the right frontal lobe.  Atrophy, chronic microvascular disease.   Original Report Authenticated By: Charlett Nose, M.D.      1. Stroke   2. CVA (cerebral infarction)   3. Dementia       MDM  Pt presenting with concern for slurred speech which has resolved, pt is at her baseline with dementia.  Acute frontal lobe finding on head CT.  Neurology consulted, pt admitted to [facility] or MRI /further workup and management.  All findings and plan d/w patient and son at bedside.        Ethelda Chick, MD 07/16/12 [phone]

## 2012-07-16 ENCOUNTER — Inpatient Hospital Stay (HOSPITAL_COMMUNITY): Payer: Medicare Other

## 2012-07-16 DIAGNOSIS — E785 Hyperlipidemia, unspecified: Secondary | ICD-10-CM | POA: Diagnosis present

## 2012-07-16 DIAGNOSIS — H9190 Unspecified hearing loss, unspecified ear: Secondary | ICD-10-CM

## 2012-07-16 DIAGNOSIS — F0391 Unspecified dementia with behavioral disturbance: Secondary | ICD-10-CM

## 2012-07-16 LAB — GLUCOSE, CAPILLARY
Glucose-Capillary: 102 mg/dL — ABNORMAL HIGH (ref 70–99)
Glucose-Capillary: 118 mg/dL — ABNORMAL HIGH (ref 70–99)
Glucose-Capillary: 78 mg/dL (ref 70–99)
Glucose-Capillary: 113 mg/dL — ABNORMAL HIGH (ref 70–99)

## 2012-07-16 LAB — HEMOGLOBIN A1C
Hgb A1c MFr Bld: 6.3 % — ABNORMAL HIGH (ref <5.7)
Mean Plasma Glucose: 134 mg/dL — ABNORMAL HIGH (ref <117)

## 2012-07-16 LAB — LIPID PANEL
Cholesterol: 172 mg/dL (ref 0–200)
Total CHOL/HDL Ratio: 4.4 RATIO

## 2012-07-16 MED ORDER — IOHEXOL 350 MG/ML SOLN: 50.0000 mL | Freq: Once | INTRAVENOUS | Status: AC | PRN

## 2012-07-16 MED ORDER — LORAZEPAM 2 MG/ML IJ SOLN
0.5000 mg | Freq: Once | INTRAMUSCULAR | Status: AC
  Administered 2012-07-16: 0.5 mg via INTRAVENOUS
  Filled 2012-07-16: qty 1

## 2012-07-16 MED ORDER — HALOPERIDOL LACTATE 5 MG/ML IJ SOLN
1.0000 mg | INTRAMUSCULAR | Status: AC
  Administered 2012-07-16: 1 mg via INTRAVENOUS
  Filled 2012-07-16: qty 1

## 2012-07-16 MED ORDER — ATORVASTATIN CALCIUM 10 MG PO TABS
10.0000 mg | ORAL_TABLET | Freq: Every day | ORAL | Status: DC
  Administered 2012-07-17: 10 mg via ORAL
  Filled 2012-07-16 (×3): qty 1

## 2012-07-16 NOTE — Evaluation (Signed)
Speech Language Pathology Evaluation Patient Details Name: CALA KRUCKENBERG MRN: [id number] DOB: 11-Sep-1924 Today's Date: 07/16/2012 Time: 4098-1191 SLP Time Calculation (min): 14 min  Problem List:  Patient Active Problem List  Diagnosis  . Insomnia, unspecified  . Dementia with behavioral disturbance  . Hard of hearing  . CVA (cerebral infarction)  . Dementia   Past Medical History:  Past Medical History  Diagnosis Date  . Dementia   . Insomnia, unspecified   . Dementia with behavioral disturbance 06/25/2012  . Hard of hearing 06/25/2012   Past Surgical History: History reviewed. No pertinent past surgical history. HPI:  79 yo female adm to [facility]- found to have an acute to subacute right frontal CVA.  Pt resides with family who provides 24/7 supervision and per PT, pt's family states she is near her baseline of functioning.   Pt has h/o dementia and is hearing impaired - right ear better.  No family present currently.  Pt today denies having a daughter or a granddaughter and states she lives alone.    Assessment / Plan / Recommendation Clinical Impression  Pt presents with severe cognitive linguistic deficits, known h/o dementia.  She is able to verbalize her needs but is perseverative and internally distracted with mental inflexibility.  Also paraphasia noted x1 when naming object- suspect secondary to dementia   Pt able to follow one to two commands with maximum verbal and visual cues.  Repetition of simple commands helpful to compensate for decreased attention and her hearing loss.  As per discussion with PT, family had reported pt is near baseline level of functioning,  no further SLP indicated.    Pt will benefit from close 24/7 supervision due to very poor safety awareness, probelm solving.  Please reorder if pt with cognitive linguistic changes that SLP can help to educate family to compensation strategies.      SLP Assessment  Patient does not need any further Speech  [place] Pathology Services    Follow Up Recommendations  None    Frequency and Duration        Pertinent Vitals/Pain Afebrile    SLP Goals   n/a  SLP Evaluation Prior Functioning  Cognitive/Linguistic Baseline: Baseline deficits Baseline deficit details: has dementia, no family present to assess changes   Cognition  Arousal/Alertness: Awake/alert Orientation Level: Disoriented to time;Disoriented to situation;Disoriented to place;Oriented to place (pt spells her name when asked to state it) Attention: Sustained Sustained Attention: Impaired Memory: Impaired Memory Impairment: Storage deficit;Retrieval deficit;Decreased recall of new information;Decreased short term memory;Decreased long term memory;Prospective memory Decreased Long Term Memory: Verbal basic;Functional basic Decreased Short Term Memory: Verbal basic;Functional basic Awareness: Impaired Awareness Impairment: Intellectual impairment (poor awareness to limitations) Problem Solving: Impaired (pt has sitter d/t fall risk) Problem Solving Impairment: Verbal basic;Functional basic Behaviors: Restless;Impulsive;Verbal agitation;Poor frustration tolerance Safety/Judgment: Impaired    Comprehension  Auditory Comprehension Yes/No Questions: Not tested Commands: Impaired (with repetition) One Step Basic Commands: 50-74% accurate Two Step Basic Commands: 50-74% accurate Conversation:  (pt not understanding communication at conversation level) Other Conversation Comments: pt distracted internally - continually expressing desire to leave Interfering Components: Attention;Hearing;Processing speed;Working [employer] fast streamer;Anxiety EffectiveTechniques: Repetition;Increased volume;Slowed speech;Stressing words Visual Recognition/Discrimination Discrimination: Not tested Reading Comprehension Reading Status: Not tested    Expression Expression Primary Mode of Expression: Verbal Verbal Expression Overall Verbal Expression:  Impaired (suspect near baseline) Initiation: No impairment Level of Generative/Spontaneous Verbalization: Sentence Repetition:  (DNT) Naming: Impairment Other Naming Comments:  (naming items, 1 of 2 correct without cue:  key  and pen) Verbal Errors: Aware of errors;Phonemic paraphasias (pt able to state object with describing function) Pragmatics: Impairment Impairments: Abnormal affect;Topic appropriateness;Turn Taking (pt repeated "you are gettting on my nerves") Interfering Components: Attention;Premorbid deficit Non-Verbal Means of Communication: Not applicable Other Verbal Expression Comments: pt repeatedly asked where she could wait to go home because she was not a patient Written Expression Dominant Hand: Right (per pt) Written Expression: Not tested   Oral / Motor Oral Motor/Sensory Function Facial Symmetry: Left droop Facial Strength: Reduced Facial Sensation: Reduced (?) Motor Speech Overall Motor Speech: Appears within functional limits for tasks assessed Phonation: Normal Resonance: Within functional limits Articulation: Within functional limitis Intelligibility: Intelligible Motor Planning: Witnin functional limits   GO     Donavan Burnet, MS [facility] SLP [phone]

## 2012-07-16 NOTE — Progress Notes (Addendum)
[facility] PROGRESS NOTE  TYMIA STREB ZOX:[id number] DOB: Jun 04, 1924 DOA: 07/15/2012 PCP: No primary provider on file.  Assessment/Plan: Principal Problem:   CVA (cerebral infarction): Workup is in progress. CT angiogram is pending. Echocardiogram unremarkable. On a daily aspirin. Have started statin. Active Problems:   Dementia with behavioral disturbance: Stable. Continue medications. She did pass her swallow evaluation, but needs lots of support.    Code Status: Full code Family Communication: Left message with daughter Disposition Plan: Skilled nursing versus home with family   Consultants:  Stroke service  Procedures:  Echocardiogram done 4/21: Normal. EF of 55%  Preliminary carotid Doppler negative.  Antibiotics:  None  HPI/Subjective: Patient easily agitated. Doesn't really give me any subjective report  Objective: Filed Vitals:   07/16/12 0518 07/16/12 0706 07/16/12 0935 07/16/12 1400  BP: 112/53 113/58 130/65 124/68  Pulse: 83 86 91 101  Temp: 97.9 F (36.6 C) 97.9 F (36.6 C) 98.5 F (36.9 C) 98.8 F (37.1 C)  TempSrc: Oral Oral Oral Oral  Resp:  15 18 18   Height:      Weight:      SpO2: 95% 96% 99% 97%    Intake/Output Summary (Last 24 hours) at 07/16/12 1703 Last data filed at 07/16/12 1300  Gross per 24 hour  Intake    540 ml  Output      0 ml  Net    540 ml   Filed Weights   07/15/12 2046  Weight: 50.2 kg (110 lb 10.7 oz)    Exam:   General:  Alert, chronically confused with dementia. Easily agitated.  Cardiovascular: Regular rate and rhythm, S1-S2  Respiratory: Clear to auscultation bilaterally  Abdomen: Soft, nontender, nondistended, positive bowel sounds  Musculoskeletal: No clubbing or cyanosis or edema  Neuro: Difficult to get patient to comply to check neuro   Data Reviewed: Basic Metabolic Panel:  Recent Labs Lab 07/15/12 1234  NA 141  K 4.2  CL 102  CO2 30  GLUCOSE 100*  BUN 20  CREATININE 0.89   CALCIUM 9.4   CBC:  Recent Labs Lab 07/15/12 1234  WBC 10.0  HGB 15.1*  HCT 43.7  MCV 93.4  PLT 203   CBG:  Recent Labs Lab 07/15/12 2220 07/16/12 0702 07/16/12 1135  GLUCAP 123* 102* 118*     Studies: Dg Chest 2 View  07/16/2012    IMPRESSION: No acute disease.   Original Report Authenticated By: Holley Dexter, M.D.    Ct Head Wo Contrast  07/15/2012    IMPRESSION: Small area of acute to subacute infarction in the right frontal lobe.  Atrophy, chronic microvascular disease.   Original Report Authenticated By: Charlett Nose, M.D.     Scheduled Meds: . aspirin  300 mg Rectal Daily   Or  . aspirin  325 mg Oral Daily  . donepezil  10 mg Oral Daily  . heparin  5,000 Units Subcutaneous Q8H  . insulin aspart  0-5 Units Subcutaneous QHS  . insulin aspart  0-9 Units Subcutaneous TID WC  . insulin aspart  3 Units Subcutaneous TID WC  . LORazepam  0.5 mg Intravenous Once  . QUEtiapine  100 mg Oral Daily  . QUEtiapine  200 mg Oral QHS   Continuous Infusions:   Principal Problem:   CVA (cerebral infarction) Active Problems:   Dementia with behavioral disturbance   Dementia    Time spent: 20 minutes    Hollice Espy  [facility] Pager [phone] If 7PM-7AM, please contact night-coverage  at [email or web address], password [email or web address] 07/16/2012, 5:03 PM  LOS: 1 day

## 2012-07-16 NOTE — Evaluation (Signed)
Occupational Therapy Evaluation Patient Details Name: Christina Summers MRN: [id number] DOB: 11/25/1924 Today's Date: 07/16/2012 Time: 7829-5621 OT Time Calculation (min): 22 min  OT Assessment / Plan / Recommendation Clinical Impression  Pt is an 79 yr old female with history of advanced dementia and now admitted with right frontal CVA.  Pt overall supervision to min guard assist for most selfcare tasks and functional transfers.  Feel she will benefit from acute care OT to help provide education on DME use for safer independence for home.  Per daughter pt will have 24 hour supervision from family and grandaughter.  Will continued to follow in acute care but do not feel she will need any follow-up post acute OT services.    OT Assessment  Patient needs continued OT Services    Follow Up Recommendations  No OT follow up    Barriers to Discharge None    Equipment Recommendations  Tub/shower bench       Frequency  Min 2X/week    Precautions / Restrictions Precautions Precautions: Fall Precaution Comments: history of dementia Restrictions Weight Bearing Restrictions: No   Pertinent Vitals/Pain No pain reported    ADL  Eating/Feeding: Simulated;Independent Where Assessed - Eating/Feeding: Chair Grooming: Performed;Supervision/safety Where Assessed - Grooming: Unsupported standing Upper Body Bathing: Simulated;Set up Where Assessed - Upper Body Bathing: Unsupported sitting Lower Body Bathing: Simulated;Supervision/safety Where Assessed - Lower Body Bathing: Unsupported sit to stand Upper Body Dressing: Simulated;Set up Where Assessed - Upper Body Dressing: Unsupported sitting Lower Body Dressing: Performed;Supervision/safety Where Assessed - Lower Body Dressing: Unsupported sit to stand Toilet Transfer: Performed;Supervision/safety Toilet Transfer Method: Other (comment) (ambulate without assistive device) Toilet Transfer Equipment: Comfort height toilet;Grab bars Toileting -  Clothing Manipulation and Hygiene: Simulated;Supervision/safety Where Assessed - Toileting Clothing Manipulation and Hygiene: Sit to stand from 3-in-1 or toilet Tub/Shower Transfer: Performed;Moderate assistance (get down in the tub) Tub/Shower Transfer Method: Ambulating Equipment Used: Gait belt Transfers/Ambulation Related to ADLs: Pt overall min guard assist for mobility.  Frequent listing to the left and right with distraction and head turns during walking without assistive device. ADL Comments: Pt overall supervision level for most selfcare tasks.  Talked with daughter Velna Hatchet on the phone she reports that pt will go back to her house with grandaughter living with her.  Have been providing her with supervision and feel she will do better in the familiar environment.  Feel she will also benefit from a shower bench for use at home since pt needed mod assist to get up from the bottom of the tub when we practiced it.    OT Diagnosis: Generalized weakness  OT Problem List: Decreased strength;Impaired balance (sitting and/or standing);Decreased cognition OT Treatment Interventions: Self-care/ADL training;DME and/or AE instruction;Balance training   OT Goals Acute Rehab OT Goals OT Goal Formulation: Patient unable to participate in goal setting Time For Goal Achievement: 07/23/12 ADL Goals Pt Will Perform Tub/Shower Transfer: Tub transfer;Transfer tub bench;Ambulation  Visit Information  Last OT Received On: 07/16/12 Assistance Needed: +1 PT/OT Co-Evaluation/Treatment: Yes    Subjective Data  Subjective: Where you taking me? Patient Stated Goal: Did not state but pt reported that she likes to walk.   Prior Functioning     Home Living Lives With: Other (Comment) (grandaughter) Available Help at Discharge: Available 24 hours/day Type of Home: House Home Access: Stairs to enter [employer] of Steps: 2 Home Layout: One level Bathroom Shower/Tub: Agricultural engineer: Standard Home Adaptive Equipment: None Prior Function Level of Independence: Needs assistance  Needs Assistance: Meal Prep;Light Housekeeping;Bathing;Dressing;Feeding;Grooming;Toileting Bath: Supervision/set-up Dressing: Supervision/set-up Feeding: Supervision/set-up Grooming: Supervision/set-up Toileting: Supervision/set-up Meal Prep: Total Light Housekeeping: Total Able to Take Stairs?: Yes Driving: No Vocation: Retired         Pharmacist, hospital - History Baseline Vision: Wears glasses all the time Patient Visual Report: No change from baseline Vision - Assessment Eye Alignment: Within Functional Limits Vision Assessment: Vision not tested Perception Perception: Within Functional Limits Praxis Praxis: Intact   Cognition  Cognition Arousal/Alertness: Awake/alert Behavior During Therapy: WFL for tasks assessed/performed Overall Cognitive Status: History of cognitive impairments - at baseline    Extremity/Trunk Assessment Right Upper Extremity Assessment RUE ROM/Strength/Tone: Within functional levels RUE Sensation: WFL - Light Touch RUE Coordination: WFL - gross/fine motor Left Upper Extremity Assessment LUE ROM/Strength/Tone: Within functional levels LUE Sensation: WFL - Light Touch LUE Coordination: WFL - gross/fine motor Trunk Assessment Trunk Assessment: Kyphotic     Mobility Bed Mobility Bed Mobility: Supine to Sit Supine to Sit: 5: Supervision;HOB flat Transfers Transfers: Sit to Stand;Stand to Sit Sit to Stand: 5: Supervision;From bed;With upper extremity assist Stand to Sit: 5: Supervision;To toilet        Balance Balance Balance Assessed: Yes Dynamic Sitting Balance Dynamic Sitting - Balance Support: No upper extremity supported Dynamic Sitting - Level of Assistance: 5: Stand by assistance Static Standing Balance Static Standing - Balance Support: No upper extremity supported Static Standing - Level of Assistance: 5: Stand by  assistance   End of Session OT - End of Session Equipment Utilized During Treatment: Gait belt Activity Tolerance: Patient tolerated treatment well Patient left: in chair;with call bell/phone within reach;with family/visitor present Nurse Communication: Mobility status    Monty Spicher OTR/L Pager number [phone] 07/16/2012, 12:20 PM

## 2012-07-16 NOTE — Progress Notes (Signed)
Echocardiogram 2D Echocardiogram has been performed.  Christina Summers 07/16/2012, 9:33 AM

## 2012-07-16 NOTE — Progress Notes (Signed)
Stroke Team Progress Note  HISTORY Christina Summers is an 79 y.o. female who has been living in her own home with support from her granddaughter. The patient has a history of dementia and often exhibits abnormal behavior according to her son. Sometime early this morning 07/15/2012 the granddaughter's boyfriend who apparently is an Charity fundraiser noted that the patient appeared to have some left upper extremity weakness. He did a brief exam and recommended that the patient be evaluated in the emergency department. The patient was brought to [facility] where a CT scan of the head showed a small area of acute to subacute infarction in the right frontal lobe. The patient was also noted to have atrophy and chronic microvascular disease. TPA was not considered since the time of onset of symptoms was unknown and the patient has little in the way of deficits. The patient's son who accompanied the patient to the hospital today feels that the patient will require nursing home placement. The plan at this time is to admit the patient for an MRI and further workup.  SUBJECTIVE Her sitter is at the bedside, no family.    OBJECTIVE Most recent Vital Signs: Filed Vitals:   07/16/12 0325 07/16/12 0518 07/16/12 0706 07/16/12 0935  BP: 116/53 112/53 113/58 130/65  Pulse: 74 83 86 91  Temp: 97.7 F (36.5 C) 97.9 F (36.6 C) 97.9 F (36.6 C) 98.5 F (36.9 C)  TempSrc: Oral Oral Oral Oral  Resp: 16  15 18   Height:      Weight:      SpO2: 95% 95% 96% 99%   CBG (last 3)   Recent Labs  07/15/12 2220 07/16/12 0702 07/16/12 1135  GLUCAP 123* 102* 118*    IV Fluid Intake:     MEDICATIONS  . aspirin  300 mg Rectal Daily   Or  . aspirin  325 mg Oral Daily  . donepezil  10 mg Oral Daily  . heparin  5,000 Units Subcutaneous Q8H  . insulin aspart  0-5 Units Subcutaneous QHS  . insulin aspart  0-9 Units Subcutaneous TID WC  . insulin aspart  3 Units Subcutaneous TID WC  . QUEtiapine  100 mg Oral Daily  .  QUEtiapine  200 mg Oral QHS   PRN:  clonazePAM, haloperidol lactate, ondansetron (ZOFRAN) IV, senna-docusate  Diet:  Carb Control thin liquids Activity:  OOB  with assistance DVT Prophylaxis:  Heparin 5000 units sq tid   CLINICALLY SIGNIFICANT STUDIES Basic Metabolic Panel:  Recent Labs Lab 07/15/12 1234  NA 141  K 4.2  CL 102  CO2 30  GLUCOSE 100*  BUN 20  CREATININE 0.89  CALCIUM 9.4   Liver Function Tests: No results found for this basename: AST, ALT, ALKPHOS, BILITOT, PROT, ALBUMIN,  in the last 168 hours CBC:  Recent Labs Lab 07/15/12 1234  WBC 10.0  HGB 15.1*  HCT 43.7  MCV 93.4  PLT 203   Coagulation: No results found for this basename: LABPROT, INR,  in the last 168 hours Cardiac Enzymes: No results found for this basename: CKTOTAL, CKMB, CKMBINDEX, TROPONINI,  in the last 168 hours Urinalysis:  Recent Labs Lab 07/15/12 1342  COLORURINE AMBER*  LABSPEC 1.026  PHURINE 6.0  GLUCOSEU NEGATIVE  HGBUR NEGATIVE  BILIRUBINUR SMALL*  KETONESUR 15*  PROTEINUR NEGATIVE  UROBILINOGEN 1.0  NITRITE NEGATIVE  LEUKOCYTESUR NEGATIVE   Lipid Panel    Component Value Date/Time   CHOL 172 07/16/2012 0555   TRIG 160* 07/16/2012 0555  HDL 39* 07/16/2012 0555   CHOLHDL 4.4 07/16/2012 0555   VLDL 32 07/16/2012 0555   LDLCALC 101* 07/16/2012 0555   HgbA1C  Lab Results  Component Value Date   HGBA1C 6.3* 07/16/2012    Urine Drug Screen:   No results found for this basename: labopia, cocainscrnur, labbenz, amphetmu, thcu, labbarb    Alcohol Level: No results found for this basename: ETH,  in the last 168 hours  CT of the brain  07/15/2012  Small area of acute to subacute infarction in the right frontal lobe.  Atrophy, chronic microvascular disease.   CT angio of the head    MRI/A of the brain  Unable to cooperate/tolerate  2D Echocardiogram  EF 50-55% with no source of embolus.   Carotid Doppler  No evidence of hemodynamically significant internal carotid artery  stenosis. Vertebral artery flow is antegrade.   CXR   07/16/2012   No acute disease.    EKG  sinus tachycardia.   Therapy Recommendations home health PT, no OT or ST  Physical Exam   Frail elderly caucasian lady in no distress.Awake alert. Afebrile. Head is nontraumatic. Neck is supple without bruit. Hearing is diminished. Cardiac exam no murmur or gallop. Lungs are clear to auscultation. Distal pulses are well felt. Neurological exam ;  Awake alert disoriented x 3 normal speech  But nonfluent language. No face asymmetry. Tongue midline. No drift. Mild diminished fine finger movements on left. Orbits right over left upper extremity. Mild left grip weak.. Normal sensation . Normal coordination. ASSESSMENT [name] is a 79 y.o. female with significant baseline dementia presenting with LUE weakness. Suspect right brain stroke not seen on initial imaging. On no antiplatelets prior to admission. Now on aspirin 325 mg orally every day for secondary stroke prevention. Patient with resultant left hemiparesis. Work up underway.   significant baseline dementia  LDL 101, lipitor added  HgbA1c 6.3  Hospital day # 1  TREATMENT/PLAN  Continueaspirin 325 mg orally every day for secondary stroke prevention.  CT angio to confirm stroke (do not feel patient would be able to cooperate with MRI, which is preferred)  Annie Main, MSN, RN, ANVP-BC, ANP-BC, GNP-BC [facility] Pager: [phone] 07/16/2012 1:12 PM  I have personally obtained a history, examined the patient, evaluated imaging results, and formulated the assessment and plan of care. I agree with the above. Delia Heady, MD

## 2012-07-16 NOTE — Evaluation (Signed)
Physical Therapy Evaluation Patient Details Name: Christina Summers MRN: [id number] DOB: [birth year] Today's Date: 07/16/2012 Time: 3474-2595 PT Time Calculation (min): 23 min  PT Assessment / Plan / Recommendation Clinical Impression  Pt is an 79 yr old female with history of advanced dementia and now admitted with small right frontal CVA. Presents to PT at supervision-mingaurdA with all mobility today and per dtr close to baseline. Does have balance deficits however given her dementia unsure how much she would benefit from PT or how much she would retain. Will trial PT 2x/wk while in the acute setting to see if she will participate. Will need strict 24 hour supervision at home which family reports she will have.     PT Assessment  Patient needs continued PT services    Follow Up Recommendations  Home health PT;Supervision/Assistance - 24 hour    Does the patient have the potential to tolerate intense rehabilitation      Barriers to Discharge        Equipment Recommendations  None recommended by PT    Recommendations for Other Services     Frequency Min 2X/week    Precautions / Restrictions Precautions Precautions: Fall Precaution Comments: history of dementia Restrictions Weight Bearing Restrictions: No   Pertinent Vitals/Pain Denies pain      Mobility  Bed Mobility Bed Mobility: Supine to Sit Supine to Sit: 5: Supervision;HOB flat Transfers Sit to Stand: 5: Supervision;From bed;With upper extremity assist Stand to Sit: 5: Supervision;To toilet Ambulation/Gait Ambulation/Gait Assistance: 4: Min guard;4: Min Environmental consultant (Feet): 300 Feet Assistive device: None Ambulation/Gait Assistance Details: occasional minA to redirect patient, mingaurdA throughout most ambulation staggering and veering off course when distracted, internally distracted, difficulty following commands at time (hearing deficit) Gait Pattern: Step-through pattern;Shuffle Gait velocity:  WFL Stairs: Yes Stairs Assistance: 5: Supervision Stairs Assistance Details (indicate cue type and reason): supervision for safety given dementia Stair Management Technique: Two rails Number of Stairs: 5    Exercises     PT Diagnosis: Abnormality of gait  PT Problem List: Decreased balance;Decreased cognition;Decreased safety awareness PT Treatment Interventions: Gait training;Balance training;Therapeutic exercise;Therapeutic activities;Patient/family education   PT Goals Acute Rehab PT Goals PT Goal Formulation: With patient Time For Goal Achievement: 07/23/12 Potential to Achieve Goals: Fair Pt will Ambulate: >150 feet;with supervision PT Goal: Ambulate - Progress: Goal set today  Visit Information  Last PT Received On: 07/16/12 Assistance Needed: +1    Subjective Data  Subjective: I want to walk! Patient Stated Goal: family wants her to come home   Prior Functioning  Home Living Lives With: Other (Comment) (grandaughter) Available Help at Discharge: Available 24 hours/day Type of Home: House Home Access: Stairs to enter [employer] of Steps: 2 Home Layout: One level Bathroom Shower/Tub: Engineer, manufacturing systems: Standard Home Adaptive Equipment: None Prior Function Level of Independence: Needs assistance Needs Assistance: Meal Prep;Light Housekeeping;Bathing;Dressing;Feeding;Grooming;Toileting Bath: Supervision/set-up Dressing: Supervision/set-up Feeding: Supervision/set-up Grooming: Supervision/set-up Toileting: Supervision/set-up Meal Prep: Total Light Housekeeping: Total Able to Take Stairs?: Yes Driving: No Vocation: Retired    Copywriter, advertising Arousal/Alertness: Awake/alert Behavior During Therapy: WFL for tasks assessed/performed Overall Cognitive Status: History of cognitive impairments - at baseline    Extremity/Trunk Assessment Right Upper Extremity Assessment RUE ROM/Strength/Tone: Within functional levels RUE Sensation:  WFL - Light Touch RUE Coordination: WFL - gross/fine motor Left Upper Extremity Assessment LUE ROM/Strength/Tone: Within functional levels LUE Sensation: WFL - Light Touch LUE Coordination: WFL - gross/fine motor Right Lower Extremity Assessment RLE ROM/Strength/Tone: [facility]  for tasks assessed Left Lower Extremity Assessment LLE ROM/Strength/Tone: [facility] for tasks assessed Trunk Assessment Trunk Assessment: Kyphotic   Balance Balance Balance Assessed: Yes Dynamic Sitting Balance Dynamic Sitting - Balance Support: No upper extremity supported Dynamic Sitting - Level of Assistance: 5: Stand by assistance Static Standing Balance Static Standing - Balance Support: No upper extremity supported Static Standing - Level of Assistance: 5: Stand by assistance  End of Session PT - End of Session Equipment Utilized During Treatment: Gait belt Activity Tolerance: Patient tolerated treatment well Patient left: in chair;with call bell/phone within reach (sitter present)  GP     [facility] HELEN 07/16/2012, 12:55 PM

## 2012-07-16 NOTE — Progress Notes (Signed)
VASCULAR LAB PRELIMINARY  PRELIMINARY  PRELIMINARY  PRELIMINARY  Carotid Dopplers completed.    Preliminary report:  There is no ICA stenosis.  Vertebral artery flow is antegrade.  Querida Beretta, RVT 07/16/2012, 9:03 AM

## 2012-07-16 NOTE — Discharge Instructions (Signed)
STROKE/TIA DISCHARGE INSTRUCTIONS SMOKING Cigarette smoking nearly doubles your risk of having a stroke & is the single most alterable risk factor  If you smoke or have smoked in the last 12 months, you are advised to quit smoking for your health.  Most of the excess cardiovascular risk related to smoking disappears within a year of stopping.  Ask you doctor about anti-smoking medications  [place] Quit Line: 1-800-QUIT NOW  Free Smoking Cessation Classes [phone]  CHOLESTEROL Know your levels; limit fat & cholesterol in your diet  Lipid Panel     Component Value Date/Time   CHOL 172 07/16/2012 0555   TRIG 160* 07/16/2012 0555   HDL 39* 07/16/2012 0555   CHOLHDL 4.4 07/16/2012 0555   VLDL 32 07/16/2012 0555   LDLCALC 101* 07/16/2012 0555      Many patients benefit from treatment even if their cholesterol is at goal.  Goal: Total Cholesterol (CHOL) less than 160  Goal:  Triglycerides (TRIG) less than 150  Goal:  HDL greater than 40  Goal:  LDL (LDLCALC) less than 100   BLOOD PRESSURE American Stroke Association blood pressure target is less that 120/80 mm/Hg  Your discharge blood pressure is:  BP: 113/58 mmHg  Monitor your blood pressure  Limit your salt and alcohol intake  Many individuals will require more than one medication for high blood pressure  DIABETES (A1c is a blood sugar average for last 3 months) Goal HGBA1c is under 7% (HBGA1c is blood sugar average for last 3 months)  Diabetes: {STROKE DC DIABETES:22357}    No results found for this basename: HGBA1C     Your HGBA1c can be lowered with medications, healthy diet, and exercise.  Check your blood sugar as directed by your physician  Call your physician if you experience unexplained or low blood sugars.  PHYSICAL ACTIVITY/REHABILITATION Goal is 30 minutes at least 4 days per week    {STROKE DC ACTIVITY/REHAB:22359}  Activity decreases your risk of heart attack and stroke and makes your heart stronger.  It helps  control your weight and blood pressure; helps you relax and can improve your mood.  Participate in a regular exercise program.  Talk with your doctor about the best form of exercise for you (dancing, walking, swimming, cycling).  DIET/WEIGHT Goal is to maintain a healthy weight  Your discharge diet is: Carb Control *** liquids Your height is:  Height: 5\' 3"  (160 cm) Your current weight is: Weight: 50.2 kg (110 lb 10.7 oz) Your Body Mass Index (BMI) is:  BMI (Calculated): 19.6  Following the type of diet specifically designed for you will help prevent another stroke.  Your goal weight range is:  ***  Your goal Body Mass Index (BMI) is 19-24.  Healthy food habits can help reduce 3 risk factors for stroke:  High cholesterol, hypertension, and excess weight.  RESOURCES Stroke/Support Group:  Call [phone]  they meet the 3rd Sunday of the month on the Rehab Unit at [facility], [place] ( no meetings June, July & Aug).  STROKE EDUCATION PROVIDED/REVIEWED AND GIVEN TO PATIENT Stroke warning signs and symptoms How to activate emergency medical system (call 911). Medications prescribed at discharge. Need for follow-up after discharge. Personal risk factors for stroke. Pneumonia vaccine given:   {STROKE DC YES/NO/DATE:22363} Flu vaccine given:   {STROKE DC YES/NO/DATE:22363} My questions have been answered, the writing is legible, and I understand these instructions.  I will adhere to these goals & educational materials that have been provided to me  after my discharge from the hospital.

## 2012-07-16 NOTE — Progress Notes (Signed)
UR COMPLETED  

## 2012-07-16 NOTE — Evaluation (Addendum)
SLP Cancellation Note  Patient Details Name: Christina Summers MRN: [id number] DOB: Aug 20, 1924   Cancelled treatment:       Reason Eval/Treat Not Completed:  (pt with RN trying to get new IV in place, will reattempt)   Donavan Burnet, MS [facility] SLP [phone]  SLP able to evaluate pt after RN attempts to place IV.  Full report to follow.

## 2012-07-17 ENCOUNTER — Inpatient Hospital Stay (HOSPITAL_COMMUNITY): Payer: Medicare Other

## 2012-07-17 LAB — GLUCOSE, CAPILLARY
Glucose-Capillary: 110 mg/dL — ABNORMAL HIGH (ref 70–99)
Glucose-Capillary: 119 mg/dL — ABNORMAL HIGH (ref 70–99)
Glucose-Capillary: 106 mg/dL — ABNORMAL HIGH (ref 70–99)

## 2012-07-17 MED ORDER — IOHEXOL 350 MG/ML SOLN
50.0000 mL | Freq: Once | INTRAVENOUS | Status: AC | PRN
  Administered 2012-07-17: 50 mL via INTRAVENOUS

## 2012-07-17 NOTE — Progress Notes (Signed)
Patient ID: Christina Summers  female  WUJ:[id number]    DOB: [birth year]    DOA: 07/15/2012  PCP: No primary provider on file.  Assessment/Plan: Principal Problem:   CVA (cerebral infarction) - Still unsuccessful with CT angiogram due to agitation - Stroke service following, continue aspirin, statin  Active Problems:   Dementia with behavioral disturbance    Other and unspecified hyperlipidemia - Continue statin  DVT Prophylaxis:  Code Status: Full CODE STATUS  Disposition: Will need a skilled nursing facility    Subjective: Patient alert and smiling, not cooperative with exam  Objective: Weight change:   Intake/Output Summary (Last 24 hours) at 07/17/12 1138 Last data filed at 07/16/12 1700  Gross per 24 hour  Intake    700 ml  Output      0 ml  Net    700 ml   Blood pressure 123/66, pulse 98, temperature 98.1 F (36.7 C), temperature source Oral, resp. rate 17, height 5\' 3"  (1.6 m), weight 50.2 kg (110 lb 10.7 oz), SpO2 97.00%.  Physical Exam: General: Alert and awake, not in any acute distress, confused. CVS: S1-S2 clear, no murmur rubs or gallops Chest: clear to auscultation bilaterally, no wheezing, rales or rhonchi Abdomen: soft nontender, nondistended, normal bowel sounds Extremities: no cyanosis, clubbing or edema noted bilaterally Neuro: Not cooperative with exam  Lab Results: Basic Metabolic Panel:  Recent Labs Lab 07/15/12 1234  NA 141  K 4.2  CL 102  CO2 30  GLUCOSE 100*  BUN 20  CREATININE 0.89  CALCIUM 9.4   CBC:  Recent Labs Lab 07/15/12 1234  WBC 10.0  HGB 15.1*  HCT 43.7  MCV 93.4  PLT 203   CBG:  Recent Labs Lab 07/16/12 1135 07/16/12 1633 07/16/12 2303 07/17/12 0720 07/17/12 1110  GLUCAP 118* 78 113* 110* 113*     Micro Results: No results found for this or any previous visit (from the past 240 hour(s)).  Studies/Results: Dg Chest 2 View  07/16/2012  *RADIOLOGY REPORT*  Clinical Data: Stroke.  CHEST - 2 VIEW   Comparison: PA and lateral chest 11/25/2007.  Findings: Lungs are clear.  Lung volumes are somewhat low.  Heart size is normal.  No pneumothorax or pleural effusion.  Mild, remote L1 superior endplate compression fracture is again seen.  IMPRESSION: No acute disease.   Original Report Authenticated By: Holley Dexter, M.D.    Ct Head Wo Contrast  07/15/2012  *RADIOLOGY REPORT*  Clinical Data: Stroke symptoms.  CT HEAD WITHOUT CONTRAST  Technique:  Contiguous axial images were obtained from the base of the skull through the vertex without contrast.  Comparison: 05/30/2011  Findings: There is a small ill-defined low density area in the right frontal lobe concerning for acute to subacute infarction. The There is atrophy and chronic small vessel disease changes.  No hemorrhage.  No hydrocephalus.  No mass lesion.  No acute calvarial abnormality.  Visualized paranasal sinuses and mastoids clear.  Orbital soft tissues unremarkable.  IMPRESSION: Small area of acute to subacute infarction in the right frontal lobe.  Atrophy, chronic microvascular disease.   Original Report Authenticated By: Charlett Nose, M.D.     Medications: Scheduled Meds: . aspirin  300 mg Rectal Daily   Or  . aspirin  325 mg Oral Daily  . atorvastatin  10 mg Oral q1800  . donepezil  10 mg Oral Daily  . heparin  5,000 Units Subcutaneous Q8H  . insulin aspart  0-5 Units Subcutaneous QHS  . insulin  aspart  0-9 Units Subcutaneous TID WC  . insulin aspart  3 Units Subcutaneous TID WC  . QUEtiapine  100 mg Oral Daily  . QUEtiapine  200 mg Oral QHS      LOS: 2 days   Raena Pau M.D. [facility] 07/17/2012, 11:38 AM Pager: [phone]  If 7PM-7AM, please contact night-coverage [email or web address] Password [email or web address]

## 2012-07-17 NOTE — Progress Notes (Signed)
Stroke Team Progress Note  HISTORY Christina Summers is an 79 y.o. female who has been living in her own home with support from her granddaughter. The patient has a history of dementia and often exhibits abnormal behavior according to her son. Sometime early this morning 07/15/2012 the granddaughter's boyfriend who apparently is an Charity fundraiser noted that the patient appeared to have some left upper extremity weakness. He did a brief exam and recommended that the patient be evaluated in the emergency department. The patient was brought to [facility] where a CT scan of the head showed a small area of acute to subacute infarction in the right frontal lobe. The patient was also noted to have atrophy and chronic microvascular disease. TPA was not considered since the time of onset of symptoms was unknown and the patient has little in the way of deficits. The patient's son who accompanied the patient to the hospital today feels that the patient will require nursing home placement. The plan at this time is to admit the patient for an MRI and further workup.  SUBJECTIVE Her sitter is at the bedside, no family.  Unable to get CTA yesterday due to agitation.  OBJECTIVE Most recent Vital Signs: Filed Vitals:   07/16/12 1741 07/16/12 2303 07/17/12 0716 07/17/12 0919  BP: 129/68 145/69 133/87 123/66  Pulse: 95 97 110 98  Temp: 97.3 F (36.3 C) 98.1 F (36.7 C) 98.1 F (36.7 C) 98.1 F (36.7 C)  TempSrc: Oral Oral Oral Oral  Resp: 18 22 18 17   Height:      Weight:      SpO2: 97% 96% 95% 97%   CBG (last 3)   Recent Labs  07/16/12 2303 07/17/12 0720 07/17/12 1110  GLUCAP 113* 110* 113*    IV Fluid Intake:     MEDICATIONS  . aspirin  300 mg Rectal Daily   Or  . aspirin  325 mg Oral Daily  . atorvastatin  10 mg Oral q1800  . donepezil  10 mg Oral Daily  . heparin  5,000 Units Subcutaneous Q8H  . insulin aspart  0-5 Units Subcutaneous QHS  . insulin aspart  0-9 Units Subcutaneous TID WC  .  insulin aspart  3 Units Subcutaneous TID WC  . QUEtiapine  100 mg Oral Daily  . QUEtiapine  200 mg Oral QHS   PRN:  clonazePAM, haloperidol lactate, ondansetron (ZOFRAN) IV, senna-docusate  Diet:  Carb Control thin liquids Activity:  OOB  with assistance DVT Prophylaxis:  Heparin 5000 units sq tid   CLINICALLY SIGNIFICANT STUDIES Basic Metabolic Panel:   Recent Labs Lab 07/15/12 1234  NA 141  K 4.2  CL 102  CO2 30  GLUCOSE 100*  BUN 20  CREATININE 0.89  CALCIUM 9.4   Liver Function Tests: No results found for this basename: AST, ALT, ALKPHOS, BILITOT, PROT, ALBUMIN,  in the last 168 hours CBC:   Recent Labs Lab 07/15/12 1234  WBC 10.0  HGB 15.1*  HCT 43.7  MCV 93.4  PLT 203   Coagulation: No results found for this basename: LABPROT, INR,  in the last 168 hours Cardiac Enzymes: No results found for this basename: CKTOTAL, CKMB, CKMBINDEX, TROPONINI,  in the last 168 hours Urinalysis:   Recent Labs Lab 07/15/12 1342  COLORURINE AMBER*  LABSPEC 1.026  PHURINE 6.0  GLUCOSEU NEGATIVE  HGBUR NEGATIVE  BILIRUBINUR SMALL*  KETONESUR 15*  PROTEINUR NEGATIVE  UROBILINOGEN 1.0  NITRITE NEGATIVE  LEUKOCYTESUR NEGATIVE   Lipid Panel  Component Value Date/Time   CHOL 172 07/16/2012 0555   TRIG 160* 07/16/2012 0555   HDL 39* 07/16/2012 0555   CHOLHDL 4.4 07/16/2012 0555   VLDL 32 07/16/2012 0555   LDLCALC 101* 07/16/2012 0555   HgbA1C  Lab Results  Component Value Date   HGBA1C 6.3* 07/16/2012    Urine Drug Screen:   No results found for this basename: labopia,  cocainscrnur,  labbenz,  amphetmu,  thcu,  labbarb    Alcohol Level: No results found for this basename: ETH,  in the last 168 hours  CT of the brain  07/15/2012  Small area of acute to subacute infarction in the right frontal lobe.  Atrophy, chronic microvascular disease.   CT angio of the head    MRI/A of the brain  Unable to cooperate/tolerate  2D Echocardiogram  EF 50-55% with no source of  embolus.   Carotid Doppler  No evidence of hemodynamically significant internal carotid artery stenosis. Vertebral artery flow is antegrade.   CXR   07/16/2012   No acute disease.    EKG  sinus tachycardia.   Therapy Recommendations home health PT, no OT or ST  Physical Exam   Frail elderly caucasian lady in no distress.Awake alert. Afebrile. Head is nontraumatic. Neck is supple without bruit. Hearing is diminished. Cardiac exam no murmur or gallop. Lungs are clear to auscultation. Distal pulses are well felt. Neurological exam ;  Awake alert disoriented x 3 normal speech  But nonfluent language. No face asymmetry. Tongue midline. No drift. Mild diminished fine finger movements on left. Orbits right over left upper extremity. Mild left grip weak.. Normal sensation . Normal coordination.  ASSESSMENT [name] is a 79 y.o. female with significant baseline dementia presenting with LUE weakness. Suspect right brain stroke not seen on initial imaging. CTA pending to confirm stroke. On no antiplatelets prior to admission. Now on aspirin 325 mg orally every day for secondary stroke prevention. Patient with resultant left hemiparesis. Work up underway.   significant baseline dementia  LDL 101, lipitor added  HgbA1c 6.3  Hospital day # 2  TREATMENT/PLAN  Continue aspirin 325 mg orally every day for secondary stroke prevention.  CT angio to confirm stroke (do not feel patient would be able to cooperate with MRI, which is preferred). They will reattempt today  Annie Main, MSN, RN, ANVP-BC, ANP-BC, GNP-BC [facility] Pager: [phone] 07/17/2012 12:25 PM  I have personally obtained a history, examined the patient, evaluated imaging results, and formulated the assessment and plan of care. I agree with the above.  Delia Heady, MD

## 2012-07-17 NOTE — Progress Notes (Signed)
Late Entry: Pt taken down for CT angiogram at 2208. Pt calm after receiving 1 mg Haldol IV. Allowed new IV to be placed. Technician from CT called RN at 2230, stating that CT could not be done because pt was "moving all over the place". Pt brought back up to the floor and has been sleeping since. Will continue to monitor pt and attempt CT again in am.  Salvadore Oxford, RN [phone] 07/17/12

## 2012-07-18 LAB — GLUCOSE, CAPILLARY: Glucose-Capillary: 133 mg/dL — ABNORMAL HIGH (ref 70–99)

## 2012-07-18 MED ORDER — ATORVASTATIN CALCIUM 10 MG PO TABS: 10.0000 mg | ORAL_TABLET | Freq: Every day | ORAL | Status: DC

## 2012-07-18 MED ORDER — ASPIRIN 325 MG PO TABS: 325.0000 mg | ORAL_TABLET | Freq: Every day | ORAL | Status: DC

## 2012-07-18 NOTE — Progress Notes (Addendum)
Physical Therapy Treatment Patient Details Name: Christina Summers MRN: [id number] DOB: 02/13/1925 Today's Date: 07/18/2012 Time: 5621-3086 PT Time Calculation (min): 13 min  PT Assessment / Plan / Recommendation Comments on Treatment Session  79 y/o female with h/o dementia adm. for CVA. Ambulating today and participating some in balance activities however limited by cognition. Will continue to trial for participation in PT. Noted plans for d/c to SNF?? I have not personally spoken to the patient's family members but on evaluation the occupational therapist had spoken to her daughter who was interested in taking her home with 24 hour supervision to prevent any further exacerbation of her dementia. This will need to be clarified by the case manager.     Follow Up Recommendations  Supervision/Assistance - 24 hour;Home health PT (vs SNF pending family ability to provide 24 hour assist)     Does the patient have the potential to tolerate intense rehabilitation     Barriers to Discharge        Equipment Recommendations  None recommended by PT    Recommendations for Other Services    Frequency Min 2X/week   Plan      Precautions / Restrictions Precautions Precautions: Fall Precaution Comments: history of dementia   Pertinent Vitals/Pain Denies pain    Mobility  Bed Mobility Details for Bed Mobility Assistance: bed alarm sounding when I entered her room, she was already in the bathroom and on the toilet on presentation Transfers Transfers: Sit to Stand;Stand to Sit Sit to Stand: 5: Supervision;From toilet;With upper extremity assist Stand to Sit: 5: Supervision;To toilet;With upper extremity assist Details for Transfer Assistance: supervision for safety Ambulation/Gait Ambulation/Gait Assistance: 4: Min guard;5: Supervision Ambulation Distance (Feet): 500 Feet Assistive device: None Ambulation/Gait Assistance Details: cues to stay on task, increased gaurding when distracted and  with head turns; didn't need any physical assist today Gait Pattern: Step-through pattern Stairs: No      PT Goals Acute Rehab PT Goals PT Goal: Ambulate - Progress: Progressing toward goal  Visit Information  Last PT Received On: 07/18/12 Assistance Needed: +1    Subjective Data  Subjective: I want to go home, take me home.    Cognition  Cognition Arousal/Alertness: Awake/alert Behavior During Therapy: WFL for tasks assessed/performed Overall Cognitive Status: History of cognitive impairments - at baseline (dementia at baseline)    Balance  High Level Balance High Level Balance Activites: Direction changes;Turns;Sudden stops;Head turns;Side stepping High Level Balance Comments: mingaurd-minA for stability with high knees marching, side steps and head turns (difficulty getting her to stay on task, distracted and wants to keep walking); good ability negotiating many turns today needing mingaurdA-supervision  End of Session PT - End of Session Equipment Utilized During Treatment: Gait belt Activity Tolerance: Patient tolerated treatment well Patient left: in bed;with call bell/phone within reach;with bed alarm set;Other (comment) (4 rails up) Nurse Communication: Mobility status   GP     [facility] HELEN 07/18/2012, 10:13 AM

## 2012-07-18 NOTE — Progress Notes (Signed)
Occupational Therapy Treatment Patient Details Name: Christina Summers MRN: [id number] DOB: 09-26-1924 Today's Date: 07/18/2012 Time: 7829-5621 OT Time Calculation (min): 15 min  OT Assessment / Plan / Recommendation Comments on Treatment Session Practiced tub transfer using tub transfer bench.  Goals met and education completed.    Follow Up Recommendations  No OT follow up    Barriers to Discharge       Equipment Recommendations  Tub/shower bench    Recommendations for Other Services    Frequency Min 2X/week   Plan Discharge plan remains appropriate;All goals met and education completed, patient discharged from OT services    Precautions / Restrictions Precautions Precautions: Fall Precaution Comments: history of dementia   Pertinent Vitals/Pain See vitals    ADL  Toilet Transfer: Buyer, retail Method: Sit to Barista:  (bed) Tub/Shower Transfer: Engineer, manufacturing Method: Science writer: Counsellor Used: Gait belt Transfers/Ambulation Related to ADLs: supervision-min guard ambulating in hall to therapy gym to practice tub transfer.  ADL Comments: Focus of session on tub transfer.  VCs for technique when using tub bench.    OT Diagnosis:    OT Problem List:   OT Treatment Interventions:     OT Goals ADL Goals Pt Will Perform Tub/Shower Transfer: Tub transfer;Transfer tub bench;Ambulation ADL Goal: Tub/Shower Transfer - Progress: Met  Visit Information  Last OT Received On: 07/18/12 Assistance Needed: +1    Subjective Data      Prior Functioning       Cognition  Cognition Arousal/Alertness: Awake/alert Behavior During Therapy: WFL for tasks assessed/performed Overall Cognitive Status: History of cognitive impairments - at baseline    Mobility  Bed Mobility Bed Mobility: Supine to Sit;Sit to Supine Supine to Sit: 5:  Supervision Sit to Supine: 5: Supervision Transfers Transfers: Sit to Stand;Stand to Sit Sit to Stand: 5: Supervision;From bed (from tub bench) Stand to Sit: 5: Supervision;To bed (to tub bench)    Exercises      Balance     End of Session OT - End of Session Equipment Utilized During Treatment: Gait belt Activity Tolerance: Patient tolerated treatment well Patient left: in bed;with call bell/phone within reach;with bed alarm set  GO    07/18/2012 [name] OTR/L Pager [phone] Office [phone]  [name] 07/18/2012, 3:43 PM

## 2012-07-18 NOTE — Progress Notes (Signed)
Stroke Team Progress Note  HISTORY Christina Summers is an 79 y.o. female who has been living in her own home with support from her granddaughter. The patient has a history of dementia and often exhibits abnormal behavior according to her son. Sometime early this morning 07/15/2012 the granddaughter's boyfriend who apparently is an Charity fundraiser noted that the patient appeared to have some left upper extremity weakness. He did a brief exam and recommended that the patient be evaluated in the emergency department. The patient was brought to [facility] where a CT scan of the head showed a small area of acute to subacute infarction in the right frontal lobe. The patient was also noted to have atrophy and chronic microvascular disease. TPA was not considered since the time of onset of symptoms was unknown and the patient has little in the way of deficits. The patient's son who accompanied the patient to the hospital today feels that the patient will require nursing home placement. The plan at this time is to admit the patient for an MRI and further workup.  SUBJECTIVE No family at bedside. Patient is asking to go home.  OBJECTIVE Most recent Vital Signs: Filed Vitals:   07/17/12 1830 07/17/12 2200 07/18/12 0200 07/18/12 0600  BP: 143/60 137/65 108/56 120/64  Pulse: 100 92 96 93  Temp: 98.4 F (36.9 C) 98.1 F (36.7 C) 97.8 F (36.6 C) 97.9 F (36.6 C)  TempSrc: Oral Oral    Resp: 18 16 14 16   Height:      Weight:      SpO2: 95% 97% 97% 95%   CBG (last 3)   Recent Labs  07/17/12 1640 07/17/12 2153 07/18/12 0633  GLUCAP 119* 106* 113*    IV Fluid Intake:     MEDICATIONS  . aspirin  300 mg Rectal Daily   Or  . aspirin  325 mg Oral Daily  . atorvastatin  10 mg Oral q1800  . donepezil  10 mg Oral Daily  . heparin  5,000 Units Subcutaneous Q8H  . insulin aspart  0-5 Units Subcutaneous QHS  . insulin aspart  0-9 Units Subcutaneous TID WC  . insulin aspart  3 Units Subcutaneous TID WC   . QUEtiapine  100 mg Oral Daily  . QUEtiapine  200 mg Oral QHS   PRN:  clonazePAM, haloperidol lactate, ondansetron (ZOFRAN) IV, senna-docusate  Diet:  Carb Control thin liquids Activity:  OOB  with assistance DVT Prophylaxis:  Heparin 5000 units sq tid   CLINICALLY SIGNIFICANT STUDIES Basic Metabolic Panel:   Recent Labs Lab 07/15/12 1234  NA 141  K 4.2  CL 102  CO2 30  GLUCOSE 100*  BUN 20  CREATININE 0.89  CALCIUM 9.4   Liver Function Tests: No results found for this basename: AST, ALT, ALKPHOS, BILITOT, PROT, ALBUMIN,  in the last 168 hours CBC:   Recent Labs Lab 07/15/12 1234  WBC 10.0  HGB 15.1*  HCT 43.7  MCV 93.4  PLT 203   Coagulation: No results found for this basename: LABPROT, INR,  in the last 168 hours Cardiac Enzymes: No results found for this basename: CKTOTAL, CKMB, CKMBINDEX, TROPONINI,  in the last 168 hours Urinalysis:   Recent Labs Lab 07/15/12 1342  COLORURINE AMBER*  LABSPEC 1.026  PHURINE 6.0  GLUCOSEU NEGATIVE  HGBUR NEGATIVE  BILIRUBINUR SMALL*  KETONESUR 15*  PROTEINUR NEGATIVE  UROBILINOGEN 1.0  NITRITE NEGATIVE  LEUKOCYTESUR NEGATIVE   Lipid Panel    Component Value Date/Time  CHOL 172 07/16/2012 0555   TRIG 160* 07/16/2012 0555   HDL 39* 07/16/2012 0555   CHOLHDL 4.4 07/16/2012 0555   VLDL 32 07/16/2012 0555   LDLCALC 101* 07/16/2012 0555   HgbA1C  Lab Results  Component Value Date   HGBA1C 6.3* 07/16/2012    Urine Drug Screen:   No results found for this basename: labopia,  cocainscrnur,  labbenz,  amphetmu,  thcu,  labbarb    Alcohol Level: No results found for this basename: ETH,  in the last 168 hours  CT of the brain  07/15/2012  Small area of acute to subacute infarction in the right frontal lobe.  Atrophy, chronic microvascular disease.   CT angio of the head  1. Negative for age intracranial CTA. No major circle Willis branch occlusion or hemodynamically significant stenosis. Mild calcified atherosclerosis  of the dominant distal right vertebral artery and ICA siphons. 2. Stable CT appearance of the brain with hypodensity in the right frontal lobe motor strip and/or subcortical white matter,  suspicious for subacute small infarct. No mass effect or hemorrhage.  MRI/A of the brain  Unable to cooperate/tolerate  2D Echocardiogram  EF 50-55% with no source of embolus.   Carotid Doppler  No evidence of hemodynamically significant internal carotid artery stenosis. Vertebral artery flow is antegrade.   CXR   07/16/2012   No acute disease.    EKG  sinus tachycardia.   Therapy Recommendations home health PT, no OT or ST  Physical Exam   Frail elderly caucasian lady in no distress.Awake alert. Afebrile. Head is nontraumatic. Neck is supple without bruit. Hearing is diminished. Cardiac exam no murmur or gallop. Lungs are clear to auscultation. Distal pulses are well felt. Neurological exam ;  Awake alert disoriented x 3 normal speech  But nonfluent language. No face asymmetry. Tongue midline. No drift. Mild diminished fine finger movements on left. Orbits right over left upper extremity. Mild left grip weak.. Normal sensation . Normal coordination.  ASSESSMENT [name] is a 79 y.o. female with significant baseline dementia presenting with LUE weakness. Suspect right brain stroke not seen on initial imaging. CTA pending to confirm stroke. On no antiplatelets prior to admission. Now on aspirin 325 mg orally every day for secondary stroke prevention. Patient with resultant left hemiparesis. Work up underway.   significant baseline dementia  LDL 101, lipitor added  HgbA1c 6.3  Hospital day # 3  TREATMENT/PLAN  Continue aspirin 325 mg orally every day for secondary stroke prevention. No further stroke workup indicated. Patient has a 10-15% risk of having another stroke over the next year, the highest risk is within 2 weeks of the most recent stroke/TIA (risk of having a stroke following a  stroke or TIA is the same). Ongoing risk factor control by Primary Care Physician Stroke Service will sign off. Please call should any needs arise. Follow up with Dr. Pearlean Brownie, Stroke Clinic, in 2 months.  Annie Main, MSN, RN, ANVP-BC, ANP-BC, [facility] Pager: [phone] 07/18/2012 11:06 AM  I have personally obtained a history, examined the patient, evaluated imaging results, and formulated the assessment and plan of care. I agree with the above. Delia Heady, MD

## 2012-07-18 NOTE — Care Management Note (Signed)
    Page 1 of 1   07/18/2012     3:30:55 PM   CARE MANAGEMENT NOTE 07/18/2012  Patient:  Christina Summers, Christina Summers   Account Number:  [id number]  Date Initiated:  07/18/2012  Documentation initiated by:  [facility]  Subjective/Objective Assessment:   admitted with CVA     Action/Plan:   PT/OT evals-recommended HHPT   Anticipated DC Date:  07/18/2012   Anticipated DC Plan:  HOME/SELF CARE      DC Planning Services  CM consult      Choice offered to / List presented to:             Status of service:  Completed, signed off Medicare Important Message given?   (If response is "NO", the following Medicare IM given date fields will be blank) Date Medicare IM given:   Date Additional Medicare IM given:    Discharge Disposition:  HOME/SELF CARE  Per UR Regulation:  Reviewed for med. necessity/level of care/duration of stay  If discussed at Long Length of Stay Meetings, dates discussed:    Comments:  07/18/12 Spoke with patient's son Duffy Rhody about d/c plans. He stated that patient will have 24/7 supervision, lives with him, his wife, their daughter and his daughter's boyfriend, who is a nurseand lives in their basement apartment. When informed that  PT recommended HHPT,he stated that he does not think that patient needs HHPT.No equipment needs identified. Jacquelynn Cree RN, BSN, CCM

## 2012-07-18 NOTE — Progress Notes (Signed)
D/c education done with her son, gave the prescriptions and talked about next appointments. Collected her home meds from pharmacy and handed it over to her son. Rolled her down in a wheelchair. No concerns.  Danne Harbor

## 2012-07-18 NOTE — Discharge Summary (Signed)
Physician Discharge Summary  Patient ID: Christina Summers MRN: [id number] DOB/AGE: 1924-07-04 79 y.o.  Admit date: 07/15/2012 Discharge date: 07/18/2012  Primary Care Physician:  No primary provider on file.  Discharge Diagnoses:    . CVA (cerebral infarction) . Dementia . Dementia with behavioral disturbance . Other and unspecified hyperlipidemia  Consults:  Neurology/stroke service   Recommendations for Outpatient Follow-up:   Fall precautions, 24/7 supervision needed at home  Discharge Medications:   Medication List    TAKE these medications       aspirin 325 MG tablet  Take 1 tablet (325 mg total) by mouth daily.     atorvastatin 10 MG tablet  Commonly known as:  LIPITOR  Take 1 tablet (10 mg total) by mouth daily.     clonazePAM 0.5 MG tablet  Commonly known as:  KLONOPIN  Take 0.5 mg by mouth 2 (two) times daily as needed for anxiety.     donepezil 10 MG tablet  Commonly known as:  ARICEPT  Take 10 mg by mouth daily.     QUEtiapine 200 MG tablet  Commonly known as:  SEROQUEL  Take 100-200 mg by mouth 2 (two) times daily. Takes 100mg  every morning and takes 200mg  every evening.         Brief H and P: For complete details please refer to admission H and P, but in brief, patient is a 79 y.o. female with past medical history of advanced dementia, not an aspirin comes in for facial droop and slurred speech that started 4 hours prior to arrival to the emergency room. As per son's as patient cannot provide a good history she was doing fine when she went to sleep when he saw this morning at around 7:30 AM she was slurring her speech and dropping stuff. Her daughter's boyfriend who is an Charity fundraiser did FACE test and she will have a left-sided facial droop so they decided to bring her here to the emergency room.   Hospital Course:  Patient is 79 year old female who presented to ED with slurred speech and left-sided facial droop. Patient had a CT scan of the head done which  showed small area of acute to subacute infarction in the right frontal lobe. Patient was not considered TPA candidate since time of onset of symptoms was unknown. Patient was admitted for stroke workup. Acute CVA: Patient was admitted for severe workup. Patient also has significant baseline dementia. Patient underwent CT angiogram which showed hypodensity in the right frontal lobe motor strip and/or subcortical white matter suspicious for subacute small infarct. Negative for each intracranial CTA. Due to advanced dementia and agitation, it was difficult to get CT angiogram, although MRI is the preferred imaging. 2-D echo showed EF of 50-55%. Carotid Dopplers showed no ICA stenosis. Continue aspirin and Lipitor.  Patient was also evaluated by physical therapy who recommended 24 hour supervision with home health physical therapy or a skilled nursing facility. Patient's family reported that patient lives with family and always has 24 hours supervision, declined skilled nursing facility placement. They also declined home health PT. Patient was last seen ambulating in the hallway with  RN without any difficulty. Hyperlipidemia: Patient was started on statin  Diet controlled diabetes: Patient was not on any oral hypoglycemics prior to admission. Hemoglobin A1c 6.3. Patient was placed on sliding scale insulin and carb modified diet. Patient should continue carb modified diet and check blood sugars routinely. If needed she can be started on low dose of oral hypoglycemics outpatient.  Day of Discharge BP 120/64  Pulse 93  Temp(Src) 97.9 F (36.6 C) (Oral)  Resp 16  Ht 5\' 3"  (1.6 m)  Wt 50.2 kg (110 lb 10.7 oz)  BMI 19.61 kg/m2  SpO2 95%  Physical Exam: General: Alert and awake  not in any acute distress. CVS: S1-S2 clear no murmur rubs or gallops Chest: clear to auscultation bilaterally, no wheezing rales or rhonchi Abdomen: soft nontender, nondistended, normal bowel sounds, Extremities: no  cyanosis, clubbing or edema noted bilaterally   The results of significant diagnostics from this hospitalization (including imaging, microbiology, ancillary and laboratory) are listed below for reference.    LAB RESULTS: Basic Metabolic Panel:  Recent Labs Lab 07/15/12 1234  NA 141  K 4.2  CL 102  CO2 30  GLUCOSE 100*  BUN 20  CREATININE 0.89  CALCIUM 9.4   CBC:  Recent Labs Lab 07/15/12 1234  WBC 10.0  HGB 15.1*  HCT 43.7  MCV 93.4  PLT 203   CBG:  Recent Labs Lab 07/17/12 2153 07/18/12 0633  GLUCAP 106* 113*    Significant Diagnostic Studies:  Dg Chest 2 View  07/16/2012  *RADIOLOGY REPORT*  Clinical Data: Stroke.  CHEST - 2 VIEW  Comparison: PA and lateral chest 11/25/2007.  Findings: Lungs are clear.  Lung volumes are somewhat low.  Heart size is normal.  No pneumothorax or pleural effusion.  Mild, remote L1 superior endplate compression fracture is again seen.  IMPRESSION: No acute disease.   Original Report Authenticated By: Holley Dexter, M.D.    Ct Head Wo Contrast  07/15/2012  *RADIOLOGY REPORT*  Clinical Data: Stroke symptoms.  CT HEAD WITHOUT CONTRAST  Technique:  Contiguous axial images were obtained from the base of the skull through the vertex without contrast.  Comparison: 05/30/2011  Findings: There is a small ill-defined low density area in the right frontal lobe concerning for acute to subacute infarction. The There is atrophy and chronic small vessel disease changes.  No hemorrhage.  No hydrocephalus.  No mass lesion.  No acute calvarial abnormality.  Visualized paranasal sinuses and mastoids clear.  Orbital soft tissues unremarkable.  IMPRESSION: Small area of acute to subacute infarction in the right frontal lobe.  Atrophy, chronic microvascular disease.   Original Report Authenticated By: Charlett Nose, M.D.     2D ECHO:  Study Conclusions  - Left ventricle: The cavity size was normal. Wall thickness was normal. Systolic function was normal.  The estimated ejection fraction was in the range of 50% to 55%. Although no diagnostic regional wall motion abnormality was identified, this possibility cannot be completely excluded on the basis of this study. - Mitral valve: Calcified annulus. Mildly thickened leaflets . Mild regurgitation.    Disposition and Follow-up:     Discharge Orders   Future Appointments Provider Department Dept Phone   09/24/2012 1:30 PM Nilda Riggs, NP [facility] [phone]   Future Orders Complete By Expires     Diet Carb Modified  As directed     Increase activity slowly  As directed         DISPOSITION: Home  DIET carb modified diet  ACTIVITY as tolerated   DISCHARGE FOLLOW-UP Follow-up Information   Follow up with Gates Rigg, MD. Schedule an appointment as soon as possible for a visit in 2 months. (stroke clinic)    Contact information:   [address] [place] [address] [phone]       Time spent on Discharge: 35 mins  SignedThad Ranger M.D. [facility] 07/18/2012, 11:43 AM Pager: [phone]

## 2012-09-24 ENCOUNTER — Ambulatory Visit: Payer: Medicare Other | Admitting: Nurse Practitioner

## 2012-10-30 ENCOUNTER — Ambulatory Visit: Payer: Self-pay | Admitting: Nurse Practitioner

## 2013-01-15 ENCOUNTER — Other Ambulatory Visit: Payer: Self-pay | Admitting: Neurology

## 2013-02-04 ENCOUNTER — Telehealth: Payer: Self-pay

## 2013-02-11 NOTE — Telephone Encounter (Signed)
Appt has been scheduled for 12.08.2014 with Michaell Cowing, NP

## 2013-03-04 ENCOUNTER — Other Ambulatory Visit: Payer: Self-pay | Admitting: Nurse Practitioner

## 2013-03-04 ENCOUNTER — Encounter: Payer: Self-pay | Admitting: Nurse Practitioner

## 2013-03-04 ENCOUNTER — Ambulatory Visit (INDEPENDENT_AMBULATORY_CARE_PROVIDER_SITE_OTHER): Payer: Medicare Other | Admitting: Nurse Practitioner

## 2013-03-04 VITALS — BP 144/79 | HR 83 | Ht 63.0 in | Wt 105.0 lb

## 2013-03-04 DIAGNOSIS — F0391 Unspecified dementia with behavioral disturbance: Secondary | ICD-10-CM

## 2013-03-04 DIAGNOSIS — I639 Cerebral infarction, unspecified: Secondary | ICD-10-CM

## 2013-03-04 DIAGNOSIS — I6350 Cerebral infarction due to unspecified occlusion or stenosis of unspecified cerebral artery: Secondary | ICD-10-CM

## 2013-03-04 MED ORDER — CLONAZEPAM 0.5 MG PO TABS: 0.5000 mg | ORAL_TABLET | Freq: Two times a day (BID) | ORAL | Status: DC

## 2013-03-04 MED ORDER — DONEPEZIL HCL 10 MG PO TABS: 10.0000 mg | ORAL_TABLET | Freq: Every day | ORAL | Status: DC

## 2013-03-04 MED ORDER — QUETIAPINE FUMARATE 200 MG PO TABS: ORAL_TABLET | ORAL | Status: DC

## 2013-03-04 NOTE — Telephone Encounter (Signed)
Rx faxed over to [facility] at [phone].
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
## 2013-03-04 NOTE — Patient Instructions (Signed)
Continue Aricept, Klonopin, and Seroquel, will renew Followup in 6-8 months

## 2013-04-03 ENCOUNTER — Other Ambulatory Visit: Payer: Self-pay | Admitting: Internal Medicine

## 2013-04-03 NOTE — Telephone Encounter (Signed)
Contacted pt about medication refill. Spoke with daughter-in-law. Pt has not been seen in our clinic before. Will have to schedule an appointment before refilling medication.

## 2013-07-24 ENCOUNTER — Telehealth: Payer: Self-pay | Admitting: Neurology

## 2013-07-24 DIAGNOSIS — F0391 Unspecified dementia with behavioral disturbance: Secondary | ICD-10-CM

## 2013-07-24 DIAGNOSIS — F03918 Unspecified dementia, unspecified severity, with other behavioral disturbance: Secondary | ICD-10-CM

## 2013-07-24 DIAGNOSIS — F0390 Unspecified dementia without behavioral disturbance: Secondary | ICD-10-CM

## 2013-07-24 DIAGNOSIS — I639 Cerebral infarction, unspecified: Secondary | ICD-10-CM

## 2013-07-24 NOTE — Telephone Encounter (Signed)
Patient's daughter-in-law calling--states patient is like a lion in a cage--beating on walls, doors and windows--says she is possibly immune to medication--needs very soon appt--please call.

## 2013-07-26 MED ORDER — RISPERIDONE 2 MG PO TABS: ORAL_TABLET | ORAL | Status: DC

## 2013-07-26 NOTE — Telephone Encounter (Signed)
I have called her daughter in law, patient is beating at the door, she is taking seroquel 200mg , 100mg  qam and 200mg  qhs.   She is at the house, her granddaughter, who is 80 years old,  is the care giver,

## 2013-07-26 NOTE — Telephone Encounter (Signed)
I have give her new rx Risperdal, 2mg , 1-2 tablets every night, one tablets every morning, as needed for agitation, in place of [place], she is going to followup next Monday, May fourth 2015,

## 2013-07-26 NOTE — Telephone Encounter (Signed)
Pt's daughter-in-law Adela Lank is calling stating if Dr. Krista Blue could prescribe something to help calm the pt down, pt is out of control. Pt has an appt on 07/29/13. Please advise

## 2013-07-29 ENCOUNTER — Encounter (INDEPENDENT_AMBULATORY_CARE_PROVIDER_SITE_OTHER): Payer: Self-pay

## 2013-07-29 ENCOUNTER — Ambulatory Visit (INDEPENDENT_AMBULATORY_CARE_PROVIDER_SITE_OTHER): Payer: Medicare Other | Admitting: Nurse Practitioner

## 2013-07-29 ENCOUNTER — Encounter: Payer: Self-pay | Admitting: Nurse Practitioner

## 2013-07-29 VITALS — BP 124/80 | HR 100 | Ht 61.0 in | Wt 104.0 lb

## 2013-07-29 DIAGNOSIS — F0390 Unspecified dementia without behavioral disturbance: Secondary | ICD-10-CM

## 2013-07-29 DIAGNOSIS — F0391 Unspecified dementia with behavioral disturbance: Secondary | ICD-10-CM

## 2013-07-29 DIAGNOSIS — I6350 Cerebral infarction due to unspecified occlusion or stenosis of unspecified cerebral artery: Secondary | ICD-10-CM

## 2013-07-29 DIAGNOSIS — F03918 Unspecified dementia, unspecified severity, with other behavioral disturbance: Secondary | ICD-10-CM

## 2013-07-29 MED ORDER — DONEPEZIL HCL 10 MG PO TABS: 10.0000 mg | ORAL_TABLET | Freq: Every day | ORAL | Status: AC

## 2013-07-29 NOTE — Patient Instructions (Addendum)
Continue Risperdal at  current dose does not need refills Aricept 10 mg daily, will refill until next appointment Continue aspirin 325 daily Followup in 6-8 months next appt with Dr. Krista Blue
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
## 2013-08-06 ENCOUNTER — Telehealth: Payer: Self-pay | Admitting: Neurology

## 2013-08-06 NOTE — Telephone Encounter (Signed)
Patient's granddaughter called to state that since patient had her medication changed, patient has been acting "crazy" and "running around the house beating on the doors." Please call and advise patient's granddaughter.

## 2013-08-07 NOTE — Telephone Encounter (Signed)
I have called her daughter in law, she is now taking risperidone, 2mg , 1 qam/1at night, it is not helpful. She is very agitated, beating on the door and windows.  I have suggested her to give patient, Risperidone 2mg  1 in am, 1/2 -one to one at noon, 1-2 tabs qhs, no more than 4 tabs total in one day.

## 2013-08-07 NOTE — Telephone Encounter (Signed)
Spoke to patient's granddaughter and she expressed how the patient is more agitated during the day.  When the patient gets a full tab of Risperdal in the morning she is extremely sleepy, but if she gives her one half a tab in am she is good for a little while and then gets full of energy and agitated.  She would like some advice on the best way to give medicine.

## 2013-08-12 ENCOUNTER — Telehealth: Payer: Self-pay | Admitting: Neurology

## 2013-08-12 DIAGNOSIS — F0390 Unspecified dementia without behavioral disturbance: Secondary | ICD-10-CM

## 2013-08-12 NOTE — Telephone Encounter (Signed)
I have talked with her daughter, she is taking riseperidone 2mg  3 tabs/day,   Hinton Dyer: give her a follow up with me in 2-3 weeks.

## 2013-08-12 NOTE — Telephone Encounter (Signed)
Daughter in law calling and pt is worse with the increase of risperdal as per last note with Dr. Krista Blue.  Pt beating windows, doors, scratching off frost from windows.

## 2013-08-12 NOTE — Telephone Encounter (Signed)
Patient's granddaughter called and stated risperiDONE (RISPERDAL) 2 MG tablet isn't working.  Patient condition has worsened.  Questioning if medication needs to be changed.  Request a call back to daughter in law Freda Munro.

## 2013-08-12 NOTE — Telephone Encounter (Signed)
Hinton Dyer, Please call her, we had many discussion over the phone about Mrs. Brenn's medications, her family needs to bring her in for further discussion, better to have her son, not only her daughter in law or granddaughter with her on next follow up appt, you can schedule at their convenience

## 2013-08-12 NOTE — Telephone Encounter (Signed)
Called patient daughter to scheduled a follow up with Dr.Yan and she stated to tell Dr.Yan she is about to loose her minf with her mother Dementia. I will take her off riseperidone. Daughter wants to no can she go back on Seroquel. Daughter did not schedule follow up yet.

## 2013-08-13 ENCOUNTER — Telehealth: Payer: Self-pay | Admitting: Neurology

## 2013-08-13 NOTE — Telephone Encounter (Signed)
Patient's granddaughter Jonelle Sidle calling back, requesting a return call to [facility] (pt's dtr) regarding medication @ [phone].  Thanks

## 2013-08-13 NOTE — Telephone Encounter (Signed)
Called and spoke to [place]  She was very upset and and she stated son can not take off any time she could make changes if any needed to be changed. Adela Lank was up set and she did not want schedule she just her back in Seroquel.

## 2013-08-16 ENCOUNTER — Telehealth (HOSPITAL_COMMUNITY): Payer: Self-pay

## 2013-09-03 ENCOUNTER — Ambulatory Visit: Payer: 59 | Admitting: Nurse Practitioner

## 2013-09-26 ENCOUNTER — Encounter (INDEPENDENT_AMBULATORY_CARE_PROVIDER_SITE_OTHER): Payer: Self-pay | Admitting: General Surgery

## 2013-09-26 ENCOUNTER — Encounter (INDEPENDENT_AMBULATORY_CARE_PROVIDER_SITE_OTHER): Payer: Self-pay

## 2013-09-26 ENCOUNTER — Ambulatory Visit (INDEPENDENT_AMBULATORY_CARE_PROVIDER_SITE_OTHER): Payer: Medicare Other | Admitting: General Surgery

## 2013-09-26 VITALS — BP 100/68 | HR 71 | Temp 97.3°F | Resp 16 | Wt 94.8 lb

## 2013-09-26 DIAGNOSIS — C4362 Malignant melanoma of left upper limb, including shoulder: Secondary | ICD-10-CM

## 2013-09-26 DIAGNOSIS — C4360 Malignant melanoma of unspecified upper limb, including shoulder: Secondary | ICD-10-CM

## 2013-09-26 NOTE — Progress Notes (Signed)
Patient ID: Christina Summers, female   DOB: 1924/07/03, 80 y.o.   MRN: [id number]  No chief complaint on file.   HPI Christina Summers is a 80 y.o. female.  The patient is an 80 year old female who is referred by Dr. Nevada Crane for evaluation of a left upper extremity melanoma. Biopsy results the biopsy was a shave biopsy which revealed a 3 mm deep melanoma. I spoke with Dr. Diona Foley the dermatopathologist who states that the shave biopsy just about contains the entire melanoma. He states there is a questionable previous scar at the biopsy site. In speaking with the patient's son she has not had a previous biopsy in this area. The patient's caretaker is the patient's granddaughter.  The patient was in the hospital a year ago with a TIA. She has not had any further issues at that time. Her cardiac function was revealed to be normal and her carotids without disease.  The patient is a dementia and is percent dependent on caretaker for her daily activities.  HPI  Past Medical History  Diagnosis Date  . Dementia   . Insomnia, unspecified   . Dementia with behavioral disturbance 06/25/2012  . Hard of hearing 06/25/2012    No past surgical history on file.  Family History  Problem Relation Age of Onset  . Other Mother   . Other Father   . Heart attack Father     Social History History  Substance Use Topics  . Smoking status: Never Smoker   . Smokeless tobacco: Never Used  . Alcohol Use: No    No Known Allergies  Current Outpatient Prescriptions  Medication Sig Dispense Refill  . aspirin 325 MG tablet Take 1 tablet (325 mg total) by mouth daily.  30 tablet  3  . donepezil (ARICEPT) 10 MG tablet Take 1 tablet (10 mg total) by mouth daily.  30 tablet  8  . risperiDONE (RISPERDAL) 2 MG tablet 1-2 tabs qhs, one tab qam. In replace of seroquel  90 tablet  12   No current facility-administered medications for this visit.    Review of Systems Review of Systems  Constitutional: Negative.   HENT:  Negative.   Respiratory: Negative.   Cardiovascular: Negative.   Gastrointestinal: Negative.   Neurological: Negative.   All other systems reviewed and are negative.   Blood pressure 100/68, pulse 71, temperature 97.3 F (36.3 C), temperature source Temporal, resp. rate 16, weight 94 lb 12.8 oz (43.001 kg).  Physical Exam Physical Exam  Constitutional: She is oriented to person, place, and time. She appears well-developed and well-nourished.  HENT:  Head: Normocephalic and atraumatic.  Eyes: Conjunctivae and EOM are normal. Pupils are equal, round, and reactive to light.  Neck: Normal range of motion. Neck supple.  Cardiovascular: Normal rate, regular rhythm and normal heart sounds.   Pulmonary/Chest: Effort normal and breath sounds normal.  Musculoskeletal: Normal range of motion.  Neurological: She is alert and oriented to person, place, and time.  Skin: Skin is warm and dry.     R forearm with eschar in place, non erythematous  Psychiatric: She has a normal mood and affect.    Data Reviewed Pathology reveals 3 mm deep melanoma L upper arm.  Assessment    The patient is an 80 year old female with a left upper extremity melanoma that's 3 mm deep.  There is a question of a pathological standpoint whether or not this is a recurrence and or metastasis/in transit metastasis. This is based on the fact there  is a scar on pathology review.    Plan    1. We'll proceed to the operating room after discussing with the patient's son who is the power of attorney for excision of the left upper extremity melanoma as well as a left upper extremity sentinel lymph node biopsy 2. I discussed the risks and benefits of the procedure to include but not limited to: Infection, bleeding, demonstrating structures, possible chronic wound healing issues, possible need for further surgery. The patient voiced understanding and wished to proceed. 3. Patient is to be reexamined by Dr. Juel Burrow office for  further melanomas.        Rosario Jacks., Sherrel Ploch 09/26/2013, 11:02 AM

## 2013-09-30 ENCOUNTER — Encounter (HOSPITAL_COMMUNITY): Payer: Self-pay | Admitting: Pharmacy Technician

## 2013-10-02 NOTE — Pre-Procedure Instructions (Signed)
Christina Summers  10/02/2013   Your procedure is scheduled on:  10/07/13  Report to [facility] Admitting at 12 n  Call this number if you have problems the morning of surgery: [phone]   Remember:   Do not eat food or drink liquids after midnight.   Take these medicines the morning of surgery with A SIP OF WATER: aricept   Do not wear jewelry, make-up or nail polish.  Do not wear lotions, powders, or perfumes. You may wear deodorant.  Do not shave 48 hours prior to surgery. Men may shave face and neck.  Do not bring valuables to the hospital.  [facility] is not responsible                  for any belongings or valuables.               Contacts, dentures or bridgework may not be worn into surgery.  Leave suitcase in the car. After surgery it may be brought to your room.  For patients admitted to the hospital, discharge time is determined by your                treatment team.               Patients discharged the day of surgery will not be allowed to drive  home.  Name and phone number of your driver: family  Special Instructions: Shower using CHG 2 nights before surgery and the night before surgery.  If you shower the day of surgery use CHG.  Use special wash - you have one bottle of CHG for all showers.  You should use approximately 1/3 of the bottle for each shower.   Please read over the following fact sheets that you were given: Pain Booklet, Coughing and Deep Breathing and Surgical Site Infection Prevention

## 2013-10-03 ENCOUNTER — Encounter (HOSPITAL_COMMUNITY): Payer: Self-pay

## 2013-10-03 ENCOUNTER — Encounter (HOSPITAL_COMMUNITY)
Admission: RE | Admit: 2013-10-03 | Discharge: 2013-10-03 | Disposition: A | Discharge status: Home / Self Care | Payer: Medicare Other | Source: Ambulatory Visit | Attending: General Surgery | Admitting: General Surgery

## 2013-10-03 ENCOUNTER — Ambulatory Visit (HOSPITAL_COMMUNITY)
Admission: RE | Admit: 2013-10-03 | Discharge: 2013-10-03 | Disposition: A | Discharge status: Home / Self Care | Payer: Medicare Other | Source: Ambulatory Visit | Attending: General Surgery | Admitting: General Surgery

## 2013-10-03 DIAGNOSIS — Z01818 Encounter for other preprocedural examination: Secondary | ICD-10-CM | POA: Insufficient documentation

## 2013-10-03 HISTORY — DX: Malignant (primary) neoplasm, unspecified: C80.1

## 2013-10-03 HISTORY — DX: Anxiety disorder, unspecified: F41.9

## 2013-10-03 HISTORY — DX: Cerebral infarction, unspecified: I63.9

## 2013-10-03 LAB — CBC
HEMATOCRIT: 42.6 % (ref 36.0–46.0)
Hemoglobin: 13.8 g/dL (ref 12.0–15.0)
MCH: 32.3 pg (ref 26.0–34.0)
MCHC: 32.4 g/dL (ref 30.0–36.0)
MCV: 99.8 fL (ref 78.0–100.0)
PLATELETS: 268 10*3/uL (ref 150–400)
RBC: 4.27 MIL/uL (ref 3.87–5.11)
RDW: 13 % (ref 11.5–15.5)
WBC: 7.3 10*3/uL (ref 4.0–10.5)

## 2013-10-03 LAB — BASIC METABOLIC PANEL
ANION GAP: 12 (ref 5–15)
BUN: 17 mg/dL (ref 6–23)
CHLORIDE: 104 meq/L (ref 96–112)
CO2: 27 meq/L (ref 19–32)
CREATININE: 0.69 mg/dL (ref 0.50–1.10)
Calcium: 9.1 mg/dL (ref 8.4–10.5)
GFR calc Af Amer: 88 mL/min — ABNORMAL LOW (ref >90)
GFR calc non Af Amer: 76 mL/min — ABNORMAL LOW (ref >90)
Glucose, Bld: 111 mg/dL — ABNORMAL HIGH (ref 70–99)
Potassium: 4.5 mEq/L (ref 3.7–5.3)
Sodium: 143 mEq/L (ref 137–147)

## 2013-10-06 MED ORDER — CEFAZOLIN SODIUM-DEXTROSE 2-3 GM-% IV SOLR
2.0000 g | INTRAVENOUS | Status: AC
Start: 2013-10-07 — End: 2013-10-07
  Administered 2013-10-07: 2 g via INTRAVENOUS
  Filled 2013-10-06: qty 50

## 2013-10-06 MED ORDER — CHLORHEXIDINE GLUCONATE 4 % EX LIQD
1.0000 "application " | Freq: Once | CUTANEOUS | Status: DC
  Filled 2013-10-06: qty 15

## 2013-10-07 ENCOUNTER — Encounter (HOSPITAL_COMMUNITY): Payer: Medicare Other | Admitting: Certified Registered"

## 2013-10-07 ENCOUNTER — Encounter (HOSPITAL_COMMUNITY): Payer: Self-pay | Admitting: Certified Registered"

## 2013-10-07 ENCOUNTER — Ambulatory Visit (HOSPITAL_COMMUNITY)
Admission: RE | Admit: 2013-10-07 | Discharge: 2013-10-07 | Disposition: A | Discharge status: Home / Self Care | Payer: Medicare Other | Source: Ambulatory Visit | Attending: General Surgery | Admitting: General Surgery

## 2013-10-07 ENCOUNTER — Ambulatory Visit (HOSPITAL_COMMUNITY): Payer: Medicare Other | Admitting: Certified Registered"

## 2013-10-07 ENCOUNTER — Encounter (HOSPITAL_COMMUNITY)
Admission: RE | Admit: 2013-10-07 | Discharge: 2013-10-07 | Disposition: A | Discharge status: Home / Self Care | Payer: Medicare Other | Source: Ambulatory Visit | Attending: General Surgery | Admitting: General Surgery

## 2013-10-07 ENCOUNTER — Encounter (HOSPITAL_COMMUNITY)
Admission: RE | Disposition: A | Discharge status: Home / Self Care | Payer: Self-pay | Source: Ambulatory Visit | Attending: General Surgery

## 2013-10-07 DIAGNOSIS — Z7982 Long term (current) use of aspirin: Secondary | ICD-10-CM | POA: Insufficient documentation

## 2013-10-07 DIAGNOSIS — F0391 Unspecified dementia with behavioral disturbance: Secondary | ICD-10-CM | POA: Diagnosis not present

## 2013-10-07 DIAGNOSIS — F411 Generalized anxiety disorder: Secondary | ICD-10-CM | POA: Insufficient documentation

## 2013-10-07 DIAGNOSIS — C4362 Malignant melanoma of left upper limb, including shoulder: Secondary | ICD-10-CM

## 2013-10-07 DIAGNOSIS — Z79899 Other long term (current) drug therapy: Secondary | ICD-10-CM | POA: Insufficient documentation

## 2013-10-07 DIAGNOSIS — C4360 Malignant melanoma of unspecified upper limb, including shoulder: Secondary | ICD-10-CM | POA: Insufficient documentation

## 2013-10-07 DIAGNOSIS — Z8673 Personal history of transient ischemic attack (TIA), and cerebral infarction without residual deficits: Secondary | ICD-10-CM | POA: Insufficient documentation

## 2013-10-07 DIAGNOSIS — F03918 Unspecified dementia, unspecified severity, with other behavioral disturbance: Secondary | ICD-10-CM | POA: Diagnosis not present

## 2013-10-07 HISTORY — PX: EXCISION MELANOMA WITH SENTINEL LYMPH NODE BIOPSY: SHX5628

## 2013-10-07 SURGERY — EXCISION, MELANOMA, WITH SENTINEL LYMPH NODE BIOPSY
Anesthesia: General | Site: Arm Upper | Laterality: Left

## 2013-10-07 MED ORDER — PROPOFOL 10 MG/ML IV BOLUS
INTRAVENOUS | Status: AC
  Filled 2013-10-07: qty 20

## 2013-10-07 MED ORDER — LIDOCAINE HCL (CARDIAC) 20 MG/ML IV SOLN
INTRAVENOUS | Status: AC
  Filled 2013-10-07: qty 5

## 2013-10-07 MED ORDER — LIDOCAINE HCL (CARDIAC) 20 MG/ML IV SOLN
INTRAVENOUS | Status: DC | PRN
  Administered 2013-10-07: 40 mg via INTRAVENOUS

## 2013-10-07 MED ORDER — FENTANYL CITRATE 0.05 MG/ML IJ SOLN
INTRAMUSCULAR | Status: AC
  Filled 2013-10-07: qty 2

## 2013-10-07 MED ORDER — METHYLENE BLUE 1 % INJ SOLN
INTRAMUSCULAR | Status: AC
  Filled 2013-10-07: qty 10

## 2013-10-07 MED ORDER — BUPIVACAINE HCL 0.25 % IJ SOLN
INTRAMUSCULAR | Status: DC | PRN
  Administered 2013-10-07: 10 mL

## 2013-10-07 MED ORDER — FENTANYL CITRATE 0.05 MG/ML IJ SOLN
INTRAMUSCULAR | Status: AC
  Filled 2013-10-07: qty 5

## 2013-10-07 MED ORDER — FENTANYL CITRATE 0.05 MG/ML IJ SOLN
INTRAMUSCULAR | Status: AC
  Administered 2013-10-07: 25 ug
  Filled 2013-10-07: qty 2

## 2013-10-07 MED ORDER — SODIUM CHLORIDE 0.9 % IJ SOLN
INTRAMUSCULAR | Status: DC | PRN
  Administered 2013-10-07: 15:00:00 via INTRAMUSCULAR

## 2013-10-07 MED ORDER — TECHNETIUM TC 99M SULFUR COLLOID FILTERED
0.5000 | Freq: Once | INTRAVENOUS | Status: AC | PRN
  Administered 2013-10-07: 0.5 via INTRADERMAL

## 2013-10-07 MED ORDER — OXYCODONE HCL 5 MG PO TABS
5.0000 mg | ORAL_TABLET | ORAL | Status: DC | PRN
  Administered 2013-10-07: 5 mg via ORAL

## 2013-10-07 MED ORDER — FENTANYL CITRATE 0.05 MG/ML IJ SOLN
25.0000 ug | INTRAMUSCULAR | Status: DC | PRN
Start: 2013-10-07 — End: 2013-10-07
  Administered 2013-10-07 (×5): 25 ug via INTRAVENOUS

## 2013-10-07 MED ORDER — 0.9 % SODIUM CHLORIDE (POUR BTL) OPTIME
TOPICAL | Status: DC | PRN
  Administered 2013-10-07: 1000 mL

## 2013-10-07 MED ORDER — BUPIVACAINE HCL (PF) 0.25 % IJ SOLN
INTRAMUSCULAR | Status: AC
  Filled 2013-10-07: qty 30

## 2013-10-07 MED ORDER — ROCURONIUM BROMIDE 50 MG/5ML IV SOLN
INTRAVENOUS | Status: AC
  Filled 2013-10-07: qty 1

## 2013-10-07 MED ORDER — SODIUM CHLORIDE 0.9 % IJ SOLN
INTRAMUSCULAR | Status: AC
  Filled 2013-10-07: qty 10

## 2013-10-07 MED ORDER — OXYCODONE-ACETAMINOPHEN 5-325 MG PO TABS: 1.0000 | ORAL_TABLET | ORAL | Status: DC | PRN

## 2013-10-07 MED ORDER — OXYCODONE HCL 5 MG PO TABS
ORAL_TABLET | ORAL | Status: AC
  Filled 2013-10-07: qty 1

## 2013-10-07 MED ORDER — LACTATED RINGERS IV SOLN
INTRAVENOUS | Status: DC
Start: 2013-10-07 — End: 2013-10-07
  Administered 2013-10-07: 13:00:00 via INTRAVENOUS

## 2013-10-07 MED ORDER — PROPOFOL 10 MG/ML IV BOLUS
INTRAVENOUS | Status: DC | PRN
  Administered 2013-10-07: 50 mg via INTRAVENOUS

## 2013-10-07 MED ORDER — ACETAMINOPHEN 10 MG/ML IV SOLN
INTRAVENOUS | Status: AC
  Administered 2013-10-07: 1000 mg via INTRAVENOUS
  Filled 2013-10-07: qty 100

## 2013-10-07 SURGICAL SUPPLY — 66 items
APL SKNCLS STERI-STRIP NONHPOA (GAUZE/BANDAGES/DRESSINGS) ×2
BANDAGE ELASTIC 4 VELCRO ST LF (GAUZE/BANDAGES/DRESSINGS) ×2 IMPLANT
BANDAGE GAUZE ELAST BULKY 4 IN (GAUZE/BANDAGES/DRESSINGS) IMPLANT
BENZOIN TINCTURE PRP APPL 2/3 (GAUZE/BANDAGES/DRESSINGS) ×4 IMPLANT
BLADE SURG ROTATE 9660 (MISCELLANEOUS) IMPLANT
BNDG COHESIVE 4X5 TAN STRL (GAUZE/BANDAGES/DRESSINGS) IMPLANT
BNDG GAUZE ELAST 4 BULKY (GAUZE/BANDAGES/DRESSINGS) ×2 IMPLANT
CHLORAPREP W/TINT 10.5 ML (MISCELLANEOUS) ×3 IMPLANT
CLOSURE STERI-STRIP 1/2X4 (GAUZE/BANDAGES/DRESSINGS) ×2
CLOSURE WOUND 1/2 X4 (GAUZE/BANDAGES/DRESSINGS)
CLSR STERI-STRIP ANTIMIC 1/2X4 (GAUZE/BANDAGES/DRESSINGS) ×2 IMPLANT
CONT SPEC STER OR (MISCELLANEOUS) ×1 IMPLANT
COVER SURGICAL LIGHT HANDLE (MISCELLANEOUS) ×3 IMPLANT
COVER TRANSDUCER ULTRASND GEL (DRAPE) ×3 IMPLANT
DECANTER SPIKE VIAL GLASS SM (MISCELLANEOUS) ×1 IMPLANT
DRAPE LAPAROTOMY T 102X78X121 (DRAPES) ×2 IMPLANT
DRAPE ORTHO SPLIT 77X108 STRL (DRAPES)
DRAPE SURG ORHT 6 SPLT 77X108 (DRAPES) IMPLANT
DRAPE UTILITY 15X26 W/TAPE STR (DRAPE) ×6 IMPLANT
ELECT CAUTERY BLADE 6.4 (BLADE) ×3 IMPLANT
ELECT REM PT RETURN 9FT ADLT (ELECTROSURGICAL) ×3
ELECTRODE REM PT RTRN 9FT ADLT (ELECTROSURGICAL) ×1 IMPLANT
GLOVE BIO SURGEON STRL SZ7.5 (GLOVE) ×3 IMPLANT
GLOVE BIOGEL PI IND STRL 7.5 (GLOVE) IMPLANT
GLOVE BIOGEL PI IND STRL 8 (GLOVE) ×1 IMPLANT
GLOVE BIOGEL PI INDICATOR 7.5 (GLOVE) ×2
GLOVE BIOGEL PI INDICATOR 8 (GLOVE) ×2
GLOVE SURG SS PI 7.0 STRL IVOR (GLOVE) ×4 IMPLANT
GOWN STRL REUS W/ TWL LRG LVL3 (GOWN DISPOSABLE) ×1 IMPLANT
GOWN STRL REUS W/ TWL XL LVL3 (GOWN DISPOSABLE) ×1 IMPLANT
GOWN STRL REUS W/TWL LRG LVL3 (GOWN DISPOSABLE) ×6
GOWN STRL REUS W/TWL XL LVL3 (GOWN DISPOSABLE) ×3
KIT BASIN OR (CUSTOM PROCEDURE TRAY) ×3 IMPLANT
KIT ROOM TURNOVER OR (KITS) ×3 IMPLANT
NDL 18GX1X1/2 (RX/OR ONLY) (NEEDLE) IMPLANT
NDL HYPO 25GX1X1/2 BEV (NEEDLE) ×1 IMPLANT
NEEDLE 18GX1X1/2 (RX/OR ONLY) (NEEDLE) ×3 IMPLANT
NEEDLE HYPO 25GX1X1/2 BEV (NEEDLE) ×6 IMPLANT
NS IRRIG 1000ML POUR BTL (IV SOLUTION) ×3 IMPLANT
PACK SURGICAL SETUP 50X90 (CUSTOM PROCEDURE TRAY) ×3 IMPLANT
PAD ARMBOARD 7.5X6 YLW CONV (MISCELLANEOUS) ×3 IMPLANT
PENCIL BUTTON HOLSTER BLD 10FT (ELECTRODE) ×3 IMPLANT
SPECIMEN JAR MEDIUM (MISCELLANEOUS) ×3 IMPLANT
SPECIMEN JAR SMALL (MISCELLANEOUS) ×2 IMPLANT
SPONGE GAUZE 4X4 12PLY (GAUZE/BANDAGES/DRESSINGS) IMPLANT
SPONGE GAUZE 4X4 12PLY STER LF (GAUZE/BANDAGES/DRESSINGS) ×4 IMPLANT
SPONGE LAP 18X18 X RAY DECT (DISPOSABLE) ×3 IMPLANT
STOCKINETTE IMPERVIOUS LG (DRAPES) IMPLANT
STRIP CLOSURE SKIN 1/2X4 (GAUZE/BANDAGES/DRESSINGS) IMPLANT
SUT ETHILON 3 0 FSL (SUTURE) IMPLANT
SUT ETHILON 4 0 PS 2 18 (SUTURE) IMPLANT
SUT MNCRL AB 3-0 PS2 18 (SUTURE) ×2 IMPLANT
SUT MON AB 4-0 PC3 18 (SUTURE) IMPLANT
SUT SILK 2 0 SH (SUTURE) ×3 IMPLANT
SUT VIC AB 2-0 SH 27 (SUTURE)
SUT VIC AB 2-0 SH 27X BRD (SUTURE) IMPLANT
SUT VIC AB 3-0 PS2 18 (SUTURE) ×3
SUT VIC AB 3-0 PS2 18XBRD (SUTURE) IMPLANT
SUT VIC AB 3-0 SH 18 (SUTURE) ×4 IMPLANT
SUT VIC AB 3-0 SH 27 (SUTURE)
SUT VIC AB 3-0 SH 27XBRD (SUTURE) IMPLANT
SYR CONTROL 10ML LL (SYRINGE) ×5 IMPLANT
TAPE CLOTH SURG 4X10 WHT LF (GAUZE/BANDAGES/DRESSINGS) ×2 IMPLANT
TOWEL OR 17X24 6PK STRL BLUE (TOWEL DISPOSABLE) ×3 IMPLANT
TOWEL OR 17X26 10 PK STRL BLUE (TOWEL DISPOSABLE) ×3 IMPLANT
UNDERPAD 30X30 INCONTINENT (UNDERPADS AND DIAPERS) IMPLANT

## 2013-10-07 NOTE — Discharge Instructions (Signed)
What to eat:  For your first meals, you should eat lightly; only small meals initially.  If you do not have nausea, you may eat larger meals.  Avoid spicy, greasy and heavy food.    General Anesthesia, Adult, Care After  Refer to this sheet in the next few weeks. These instructions provide you with information on caring for yourself after your procedure. Your health care provider may also give you more specific instructions. Your treatment has been planned according to current medical practices, but problems sometimes occur. Call your health care provider if you have any problems or questions after your procedure.  WHAT TO EXPECT AFTER THE PROCEDURE  After the procedure, it is typical to experience:  Sleepiness.  Nausea and vomiting. HOME CARE INSTRUCTIONS  For the first 24 hours after general anesthesia:  Have a responsible person with you.  Do not drive a car. If you are alone, do not take public transportation.  Do not drink alcohol.  Do not take medicine that has not been prescribed by your health care provider.  Do not sign important papers or make important decisions.  You may resume a normal diet and activities as directed by your health care provider.  Change bandages (dressings) as directed.  If you have questions or problems that seem related to general anesthesia, call the hospital and ask for the anesthetist or anesthesiologist on call. SEEK MEDICAL CARE IF:  You have nausea and vomiting that continue the day after anesthesia.  You develop a rash. SEEK IMMEDIATE MEDICAL CARE IF:  You have difficulty breathing.  You have chest pain.  You have any allergic problems. Document Released: 06/20/2000 Document Revised: 11/14/2012 Document Reviewed: 09/27/2012  [facility] Patient Information 2014 [place], [place].   Biopsy Care After Refer to this sheet in the next few weeks. These instructions provide you with information on caring for yourself after your procedure. Your caregiver  may also give you more specific instructions. Your treatment has been planned according to current medical practices, but problems sometimes occur. Call your caregiver if you have any problems or questions after your procedure. If you had a fine needle biopsy, you may have soreness at the biopsy site for 1 to 2 days. If you had an open biopsy, you may have soreness at the biopsy site for 3 to 4 days. HOME CARE INSTRUCTIONS   You may resume normal diet and activities as directed.  Change bandages (dressings) as directed. If your wound was closed with a skin glue (adhesive), it will wear off and begin to peel in 7 days.  Only take over-the-counter or prescription medicines for pain, discomfort, or fever as directed by your caregiver.  Ask your caregiver when you can bathe and get your wound wet. SEEK IMMEDIATE MEDICAL CARE IF:   You have increased bleeding (more than a small spot) from the biopsy site.  You notice redness, swelling, or increasing pain at the biopsy site.  You have pus coming from the biopsy site.  You have a fever.  You notice a bad smell coming from the biopsy site or dressing.  You have a rash, have difficulty breathing, or have any allergic problems. MAKE SURE YOU:   Understand these instructions.  Will watch your condition.  Will get help right away if you are not doing well or get worse. Document Released: 10/01/2004 Document Revised: 06/06/2011 Document Reviewed: 09/09/2010 [facility] Patient Information 2015 [place], [place]. This information is not intended to replace advice given to you by your health  care provider. Make sure you discuss any questions you have with your health care provider.

## 2013-10-07 NOTE — Anesthesia Preprocedure Evaluation (Addendum)
Anesthesia Evaluation  Patient identified by MRN, date of birth, ID band Patient awake    Reviewed: Allergy & Precautions, H&P , NPO status , Patient's Chart, lab work & pertinent test results  Airway Mallampati: II TM Distance: >3 FB Neck ROM: Full    Dental no notable dental hx. (+) Teeth Intact, Dental Advisory Given   Pulmonary neg pulmonary ROS,  breath sounds clear to auscultation  Pulmonary exam normal       Cardiovascular negative cardio ROS  Rhythm:regular Rate:Normal  Study Conclusions  - Left ventricle: The cavity size was normal. Wall thickness   was normal. Systolic function was normal. The estimated   ejection fraction was in the range of 50% to 55%. Although   no diagnostic regional wall motion abnormality was   identified, this possibility cannot be completely excluded   on the basis of this study. - Mitral valve: Calcified annulus. Mildly thickened leaflets   . Mild regurgitation.    Neuro/Psych Anxiety Dementia CVA, No Residual Symptoms    GI/Hepatic negative GI ROS, Neg liver ROS,   Endo/Other  negative endocrine ROS  Renal/GU negative Renal ROS  negative genitourinary   Musculoskeletal   Abdominal   Peds  Hematology negative hematology ROS (+)   Anesthesia Other Findings   Reproductive/Obstetrics negative OB ROS                          Anesthesia Physical Anesthesia Plan  ASA: III  Anesthesia Plan: General LMA and General   Post-op Pain Management:    Induction: Intravenous  Airway Management Planned: LMA  Additional Equipment:   Intra-op Plan:   Post-operative Plan: Extubation in OR  Informed Consent: I have reviewed the patients History and Physical, chart, labs and discussed the procedure including the risks, benefits and alternatives for the proposed anesthesia with the patient or authorized representative who has indicated his/her understanding and  acceptance.   Dental advisory given  Plan Discussed with: CRNA  Anesthesia Plan Comments:        Anesthesia Quick Evaluation

## 2013-10-07 NOTE — OR Nursing (Signed)
Dr. Tamala Julian notified of patient's BP 188/88. No new orders received.

## 2013-10-07 NOTE — Progress Notes (Signed)
Late Entry:  Patient received in Post-op area. Patient verbally is stating that she hurts "bad, bad, bad".  Her granddaughter who is with her corroborates that she seems very distressed.  Gave 25MCG of fentanyl but to little effect.  Granddaughter is very concerned about taking her home.  Patient continued in great distress.  Called Dr. Rosendo Gros, notified him of patients condition.  Ordered to give ofirmev and he stated he would call back in about 30 minutes, which he did do.  At that time (after the ofrimev) the patient was much calmer and seemed in much less pain than she had earlier.  Patient discharged to home with granddaughter.  No further orders from Dr. Rosendo Gros.

## 2013-10-07 NOTE — Op Note (Signed)
10/07/2013  3:13 PM  PATIENT:  Christina Summers  80 y.o. female  PRE-OPERATIVE DIAGNOSIS:  melanoma left arm  POST-OPERATIVE DIAGNOSIS:  melanoma left arm  PROCEDURE:  Procedure(s):  LEFT UPPER EXTREMITY EXCISIONAL BIOPSY (Left) ATTEMPTED SLNBx  SURGEON:  Surgeon(s) and Role:    * Ralene Ok, MD - Primary   ASSISTANTS: Carney Corners, PA-student   ANESTHESIA:   local and general  EBL:     BLOOD ADMINISTERED:none  DRAINS: none   LOCAL MEDICATIONS USED:  BUPIVICAINE   SPECIMEN:  Source of Specimen:  LUE melanoma biopsy site 4x16cm  DISPOSITION OF SPECIMEN:  PATHOLOGY  COUNTS:  YES  TOURNIQUET:  * No tourniquets in log *  DICTATION: .Dragon Dictation The paitent was taken back to the OR and placed in the supine position with SCDs in place.  She was prepped and draped in the usual sterile fashion.  A time out was call and all facts were verified.  5CC of lymphozurin blue was administered around the biopsy site.  A 2cm margin was marked in all directions, for appropriate cosmetic reasons a 4cm margin was marked in the cranial-caudal dimensions to allow the wound to close.the excisional biopsy to place down to the fascial level of the muscle. This was excised and marked with a long stitch lateral and a short stitch superior. This was sent off to pathology.  At this time this wound was reapproximated using 3-0 Vicryl subcutaneous dermal fashion as well as several fascial bites to obliterate the space. The skin was reapproximated in a running subcuticular 3-0 Monocryl. At this time using the neoprobe I placed this in the axilla however there was no activity could be seen. This was similar to the preoperative activity.  At this time proceeded to make an incision in the left axilla. Dissection was taken down to adipose tissue with an attempt to localize any lymph nodes via the dye injection. After multiple attempts to localize a lymph node there was no blood that could be seen  with blue dye were located in the appropriate at this time ago Vicryl used to reapproximate the deep dermal layer. 3-0 Monocryl used to approximate the skin in a running subcuticular fashion. The skin was then dressed with Steri-Strips gauze and tape. The left upper extremity was wrapped with an Ace bandage. The patient tolerated the procedure well. Was taken to the recovery room in stable condition.  PLAN OF CARE: Discharge to home after PACU  PATIENT DISPOSITION:  PACU - hemodynamically stable.   Delay start of Pharmacological VTE agent (>24hrs) due to surgical blood loss or risk of bleeding: not applicable

## 2013-10-07 NOTE — H&P (View-Only) (Signed)
Patient ID: Christina Summers, female   DOB: [birth year], 80 y.o.   MRN: [id number]  No chief complaint on file.   HPI Christina Summers is a 80 y.o. female.  The patient is an 80 year old female who is referred by Dr. Nevada Crane for evaluation of a left upper extremity melanoma. Biopsy results the biopsy was a shave biopsy which revealed a 3 mm deep melanoma. I spoke with Dr. Diona Foley the dermatopathologist who states that the shave biopsy just about contains the entire melanoma. He states there is a questionable previous scar at the biopsy site. In speaking with the patient's son she has not had a previous biopsy in this area. The patient's caretaker is the patient's granddaughter.  The patient was in the hospital a year ago with a TIA. She has not had any further issues at that time. Her cardiac function was revealed to be normal and her carotids without disease.  The patient is a dementia and is percent dependent on caretaker for her daily activities.  HPI  Past Medical History  Diagnosis Date  . Dementia   . Insomnia, unspecified   . Dementia with behavioral disturbance 06/25/2012  . Hard of hearing 06/25/2012    No past surgical history on file.  Family History  Problem Relation Age of Onset  . Other Mother   . Other Father   . Heart attack Father     Social History History  Substance Use Topics  . Smoking status: Never Smoker   . Smokeless tobacco: Never Used  . Alcohol Use: No    No Known Allergies  Current Outpatient Prescriptions  Medication Sig Dispense Refill  . aspirin 325 MG tablet Take 1 tablet (325 mg total) by mouth daily.  30 tablet  3  . donepezil (ARICEPT) 10 MG tablet Take 1 tablet (10 mg total) by mouth daily.  30 tablet  8  . risperiDONE (RISPERDAL) 2 MG tablet 1-2 tabs qhs, one tab qam. In replace of seroquel  90 tablet  12   No current facility-administered medications for this visit.    Review of Systems Review of Systems  Constitutional: Negative.   HENT:  Negative.   Respiratory: Negative.   Cardiovascular: Negative.   Gastrointestinal: Negative.   Neurological: Negative.   All other systems reviewed and are negative.   Blood pressure 100/68, pulse 71, temperature 97.3 F (36.3 C), temperature source Temporal, resp. rate 16, weight 94 lb 12.8 oz (43.001 kg).  Physical Exam Physical Exam  Constitutional: She is oriented to person, place, and time. She appears well-developed and well-nourished.  HENT:  Head: Normocephalic and atraumatic.  Eyes: Conjunctivae and EOM are normal. Pupils are equal, round, and reactive to light.  Neck: Normal range of motion. Neck supple.  Cardiovascular: Normal rate, regular rhythm and normal heart sounds.   Pulmonary/Chest: Effort normal and breath sounds normal.  Musculoskeletal: Normal range of motion.  Neurological: She is alert and oriented to person, place, and time.  Skin: Skin is warm and dry.     R forearm with eschar in place, non erythematous  Psychiatric: She has a normal mood and affect.    Data Reviewed Pathology reveals 3 mm deep melanoma L upper arm.  Assessment    The patient is an 80 year old female with a left upper extremity melanoma that's 3 mm deep.  There is a question of a pathological standpoint whether or not this is a recurrence and or metastasis/in transit metastasis. This is based on the fact there  is a scar on pathology review.    Plan    1. We'll proceed to the operating room after discussing with the patient's son who is the power of attorney for excision of the left upper extremity melanoma as well as a left upper extremity sentinel lymph node biopsy 2. I discussed the risks and benefits of the procedure to include but not limited to: Infection, bleeding, demonstrating structures, possible chronic wound healing issues, possible need for further surgery. The patient voiced understanding and wished to proceed. 3. Patient is to be reexamined by Dr. Juel Burrow office for  further melanomas.        Rosario Jacks., Syreeta Figler 09/26/2013, 11:02 AM

## 2013-10-07 NOTE — Anesthesia Postprocedure Evaluation (Signed)
  Anesthesia Post-op Note  Patient: Christina Summers  Procedure(s) Performed: Procedure(s):  LEFT UPPER EXTREMITY EXCISIONAL BIOPSY (Left)  Patient Location: PACU  Anesthesia Type:MAC  Level of Consciousness: awake and patient cooperative  Airway and Oxygen Therapy: Patient Spontanous Breathing  Post-op Pain: none  Post-op Assessment: Post-op Vital signs reviewed, Patient's Cardiovascular Status Stable, Respiratory Function Stable, Patent Airway and No signs of Nausea or vomiting  Post-op Vital Signs: stable  Last Vitals:  Filed Vitals:   10/07/13 1523  BP: 155/83  Pulse: 85  Temp: 36.5 C  Resp: 15    Complications: No apparent anesthesia complications

## 2013-10-07 NOTE — Interval H&P Note (Signed)
History and Physical Interval Note:  10/07/2013 1:39 PM  Christina Summers  has presented today for surgery, with the diagnosis of melanoma  The various methods of treatment have been discussed with the patient and family. After consideration of risks, benefits and other options for treatment, the patient has consented to  Procedure(s): EXCISION MELANOMA WITH LEFT UPPER EXTREMITY SENTINEL LYMPH NODE BIOPSY (Left) as a surgical intervention .  The patient's history has been reviewed, patient examined, no change in status, stable for surgery.  I have reviewed the patient's chart and labs.  Questions were answered to the patient's satisfaction.     Rosario Jacks., Anne Hahn

## 2013-10-07 NOTE — Transfer of Care (Signed)
Immediate Anesthesia Transfer of Care Note  Patient: Christina Summers  Procedure(s) Performed: Procedure(s):  LEFT UPPER EXTREMITY EXCISIONAL BIOPSY (Left)  Patient Location: PACU  Anesthesia Type:General  Level of Consciousness: awake, alert  and confused  Airway & Oxygen Therapy: Patient Spontanous Breathing  Post-op Assessment: Report given to PACU RN and Post -op Vital signs reviewed and stable  Post vital signs: Reviewed and stable  Complications: No apparent anesthesia complications

## 2013-10-07 NOTE — Anesthesia Procedure Notes (Signed)
Procedure Name: Intubation Date/Time: 10/07/2013 2:03 PM Performed by: Manuela Schwartz B Pre-anesthesia Checklist: Patient identified, Emergency Drugs available, Suction available, Patient being monitored and Timeout performed Patient Re-evaluated:Patient Re-evaluated prior to inductionOxygen Delivery Method: Circle system utilized Preoxygenation: Pre-oxygenation with 100% oxygen Intubation Type: IV induction LMA: LMA inserted LMA Size: 3.0 Number of attempts: 1 Tube secured with: Tape Dental Injury: Teeth and Oropharynx as per pre-operative assessment

## 2013-10-08 ENCOUNTER — Encounter (HOSPITAL_COMMUNITY): Payer: Self-pay | Admitting: General Surgery

## 2013-10-31 NOTE — Telephone Encounter (Signed)
Noted  

## 2014-01-04 ENCOUNTER — Emergency Department (HOSPITAL_BASED_OUTPATIENT_CLINIC_OR_DEPARTMENT_OTHER): Payer: Medicare Other

## 2014-01-04 ENCOUNTER — Encounter (HOSPITAL_BASED_OUTPATIENT_CLINIC_OR_DEPARTMENT_OTHER): Payer: Self-pay | Admitting: Emergency Medicine

## 2014-01-04 ENCOUNTER — Emergency Department (HOSPITAL_BASED_OUTPATIENT_CLINIC_OR_DEPARTMENT_OTHER)
Admission: EM | Admit: 2014-01-04 | Discharge: 2014-01-05 | Disposition: A | Discharge status: Home / Self Care | Payer: Medicare Other | Attending: Emergency Medicine | Admitting: Emergency Medicine

## 2014-01-04 DIAGNOSIS — Y939 Activity, unspecified: Secondary | ICD-10-CM | POA: Diagnosis not present

## 2014-01-04 DIAGNOSIS — F0391 Unspecified dementia with behavioral disturbance: Secondary | ICD-10-CM | POA: Insufficient documentation

## 2014-01-04 DIAGNOSIS — Z792 Long term (current) use of antibiotics: Secondary | ICD-10-CM | POA: Insufficient documentation

## 2014-01-04 DIAGNOSIS — R55 Syncope and collapse: Secondary | ICD-10-CM | POA: Insufficient documentation

## 2014-01-04 DIAGNOSIS — Y929 Unspecified place or not applicable: Secondary | ICD-10-CM | POA: Insufficient documentation

## 2014-01-04 DIAGNOSIS — S01422A Laceration with foreign body of left cheek and temporomandibular area, initial encounter: Secondary | ICD-10-CM | POA: Insufficient documentation

## 2014-01-04 DIAGNOSIS — S0181XA Laceration without foreign body of other part of head, initial encounter: Secondary | ICD-10-CM

## 2014-01-04 DIAGNOSIS — Z79899 Other long term (current) drug therapy: Secondary | ICD-10-CM | POA: Insufficient documentation

## 2014-01-04 DIAGNOSIS — Z7982 Long term (current) use of aspirin: Secondary | ICD-10-CM | POA: Insufficient documentation

## 2014-01-04 DIAGNOSIS — Z8673 Personal history of transient ischemic attack (TIA), and cerebral infarction without residual deficits: Secondary | ICD-10-CM | POA: Diagnosis not present

## 2014-01-04 DIAGNOSIS — Z85828 Personal history of other malignant neoplasm of skin: Secondary | ICD-10-CM | POA: Diagnosis not present

## 2014-01-04 DIAGNOSIS — Z8669 Personal history of other diseases of the nervous system and sense organs: Secondary | ICD-10-CM | POA: Diagnosis not present

## 2014-01-04 DIAGNOSIS — F419 Anxiety disorder, unspecified: Secondary | ICD-10-CM | POA: Diagnosis not present

## 2014-01-04 DIAGNOSIS — W1830XA Fall on same level, unspecified, initial encounter: Secondary | ICD-10-CM | POA: Diagnosis not present

## 2014-01-04 DIAGNOSIS — W19XXXA Unspecified fall, initial encounter: Secondary | ICD-10-CM

## 2014-01-04 MED ORDER — LORAZEPAM 1 MG PO TABS
0.5000 mg | ORAL_TABLET | Freq: Once | ORAL | Status: AC
  Administered 2014-01-04: 0.5 mg via ORAL
  Filled 2014-01-04: qty 1

## 2014-01-04 NOTE — ED Notes (Signed)
Spoke with Marcille Blanco, med tech from [employer] [phone]). She reports another resident came to her and stated that someone fell- she went to check the area and no one was found who had fallen- pt was found shortly after that standing on the other side of the building from where the fall was reported. Unknown circumstances of time and mechanism of fall

## 2014-01-04 NOTE — ED Provider Notes (Signed)
CSN: [id number]     Arrival date & time 01/04/14  1942 History  This chart was scribed for Shaune Pollack, MD by Edison Simon, ED Scribe. This patient was seen in room MH01/MH01 and the patient's care was started at 8:13 PM.    Level 5 caveat Chief Complaint  Patient presents with  . Fall    facial laceration   Patient is a 80 y.o. female presenting with fall. The history is provided by the patient. History limited by: dementia. No language interpreter was used.  Fall This is a new problem. The problem has not changed since onset.Pertinent negatives include no chest pain, no abdominal pain and no headaches. Nothing aggravates the symptoms. Nothing relieves the symptoms. She has tried nothing for the symptoms.   HPI Comments: Christina Summers is a 80 y.o. female who presents to the Emergency Department complaining of multiple injuries to her face status post fall just PTA. She states she lives alone at her own home though records indicate she lives at [facility]. She states she does not know what caused her to fall.  According to EMS, her fall was witnessed by another resident who also has memory impairment; staff at [facility] state that she is at her baseline. She denies pain except to her face.  RN called facility and stated another resident reported fall- patient up and ambulatory at time they went to find her.   Past Medical History  Diagnosis Date  . Insomnia, unspecified   . Hard of hearing 06/25/2012  . Stroke     tia  . Cancer     skin  . Dementia   . Dementia with behavioral disturbance 06/25/2012  . Anxiety    Past Surgical History  Procedure Laterality Date  . No past surgeries    . Excision melanoma with sentinel lymph node biopsy Left 10/07/2013    Procedure:  LEFT UPPER EXTREMITY EXCISIONAL BIOPSY;  Surgeon: Ralene Ok, MD;  Location: [facility];  Service: General;  Laterality: Left;   Family History  Problem Relation Age of Onset  . Other Mother   .  Other Father   . Heart attack Father    History  Substance Use Topics  . Smoking status: Never Smoker   . Smokeless tobacco: Never Used  . Alcohol Use: No   OB History   Grav Para Term Preterm Abortions TAB SAB Ect Mult Living                 Review of Systems  Unable to perform ROS: Dementia  Cardiovascular: Negative for chest pain.  Gastrointestinal: Negative for abdominal pain.  Musculoskeletal: Negative for arthralgias, back pain and neck pain.  Skin: Positive for wound.  Neurological: Negative for headaches.       Loss of consciousness  Psychiatric/Behavioral: Negative for confusion (dementia baseline).      Allergies  Review of patient's allergies indicates no known allergies.  Home Medications   Prior to Admission medications   Medication Sig Start Date End Date Taking? Authorizing Provider  aspirin 325 MG tablet Take 1 tablet (325 mg total) by mouth daily. 07/18/12  Yes Ripudeep Krystal Eaton, MD  clonazePAM (KLONOPIN) 0.5 MG tablet Take 0.5 mg by mouth 2 (two) times daily as needed for anxiety.   Yes Historical Provider, MD  donepezil (ARICEPT) 10 MG tablet Take 1 tablet (10 mg total) by mouth daily. 07/29/13  Yes Dennie Bible, NP  QUEtiapine (SEROQUEL) 200 MG tablet  Take 200 mg by mouth daily as needed (at bedtime).    Yes Historical Provider, MD  doxycycline (VIBRAMYCIN) 100 MG capsule Take 100 mg by mouth 2 (two) times daily. Daughter states she is taking for 10 days only started medication on 09-26-13    Historical Provider, MD  oxyCODONE-acetaminophen (ROXICET) 5-325 MG per tablet Take 1-2 tablets by mouth every 4 (four) hours as needed. 10/07/13   Ralene Ok, MD  risperiDONE (RISPERDAL) 2 MG tablet 1-2 tabs qhs, one tab qam. In replace of seroquel 07/26/13   Marcial Pacas, MD   BP 149/71  Pulse 90  Temp(Src) 98.2 F (36.8 C) (Oral)  Resp 20  SpO2 96% Physical Exam  Nursing note and vitals reviewed. Constitutional: She appears well-developed and  well-nourished.  HENT:  Head: Normocephalic and atraumatic.    Right Ear: External ear normal.  Left Ear: External ear normal.  Nose: Nose normal.  Mouth/Throat: Oropharynx is clear and moist.  Multiple lacerations 3 lacerations, 1- 2 cm, 2- 3.5 cm, 3- 4cm left cheek  Eyes: Conjunctivae and EOM are normal. Pupils are equal, round, and reactive to light.  Neck: Normal range of motion. Neck supple. No JVD present. No tracheal deviation present. No thyromegaly present.  Cardiovascular: Normal rate, regular rhythm, normal heart sounds and intact distal pulses.   Pulmonary/Chest: Effort normal and breath sounds normal. No respiratory distress. She has no wheezes.  Abdominal: Soft. Bowel sounds are normal. She exhibits no mass. There is no tenderness. There is no guarding.  Musculoskeletal: Normal range of motion.  Lymphadenopathy:    She has no cervical adenopathy.  Neurological: She is alert. She has normal reflexes. No cranial nerve deficit or sensory deficit. Gait normal. GCS eye subscore is 4. GCS verbal subscore is 5. GCS motor subscore is 6.  Reflex Scores:      Bicep reflexes are 2+ on the right side and 2+ on the left side.      Patellar reflexes are 2+ on the right side and 2+ on the left side. Strength is 5/5 bilateral elbow flexor/extensors, wrist extension/flexion, intrinsic hand strength equal Bilateral hip flexion/extension 5/5, knee flexion/extension 5/5, ankle 5/5 flexion extension    Skin: Skin is warm and dry.    ED Course  Wound repair Date/Time: 01/04/2014 9:57 PM Performed by: Shaune Pollack Authorized by: Shaune Pollack Consent: Verbal consent not obtained. written consent not obtained. Patient identity confirmed: verbally with patient Preparation: Patient was prepped and draped in the usual sterile fashion. Local anesthesia used: yes Anesthesia: local infiltration Local anesthetic: lidocaine 1% with epinephrine Anesthetic total: 1 ml Patient tolerance:  Patient tolerated the procedure well with no immediate complications. Comments: LACERATION REPAIR Performed by: Shaune Pollack Authorized by: Shaune Pollack Consent: Verbal consent obtained. Risks and benefits: risks, benefits and alternatives were discussed Consent given by: patient Patient identity confirmed: provided demographic data Prepped and Draped in normal sterile fashion Wound explored no evidence of fb seen, not through and through   Laceration Location: left cheek , 1 Laceration Leng 2.5cm Wound irrigated and repaired with 1 5-0 prolene  No Foreign Bodies seen or palpated  Anesthesia: local infiltration  Local anesthetic: lidocaine 1% w epinephrine  Anesthetic total: 2 ml  Irrigation method: syringe Amount of cleaning: standard  Skin closure: 1  Number of sutures: 1  Technique: simple interrupted  Patient tolerance: Patient tolerated the procedure well with no immediate complications.   Wound 2- steri strips Wound 3- repaired with 3- 5-0 prolene   (  including critical care time) Labs Review Labs Reviewed - No data to display  Imaging Review No results found.   EKG Interpretation None     DIAGNOSTIC STUDIES: Oxygen Saturation is 96% on room air, normal by my interpretation.    COORDINATION OF CARE: 8:22 PM History limited by dementia. I will try to obtain more information from other sources.     MDM   Final diagnoses:  Fall, initial encounter  Face lacerations, initial encounter  Dementia, with behavioral disturbance      I personally performed the services described in this documentation, which was scribed in my presence. The recorded information has been reviewed and considered.   Shaune Pollack, MD 01/04/14 [phone]

## 2014-01-04 NOTE — ED Notes (Addendum)
EMS transport from [facility]- pt had fall witnessed by other residents- pt has memory loss- laceration to left side of face- pt alert upon arrival - repeatedly asking "where am i"- staff reported pt is at baseline mental status. VS per ems 122/72, HR 88 RR16 rates pain 2/10

## 2014-01-04 NOTE — Discharge Instructions (Signed)
Sutures out in 5 days  Facial Laceration  A facial laceration is a cut on the face. These injuries can be painful and cause bleeding. Lacerations usually heal quickly, but they need special care to reduce scarring. DIAGNOSIS  Your health care provider will take a medical history, ask for details about how the injury occurred, and examine the wound to determine how deep the cut is. TREATMENT  Some facial lacerations may not require closure. Others may not be able to be closed because of an increased risk of infection. The risk of infection and the chance for successful closure will depend on various factors, including the amount of time since the injury occurred. The wound may be cleaned to help prevent infection. If closure is appropriate, pain medicines may be given if needed. Your health care provider will use stitches (sutures), wound glue (adhesive), or skin adhesive strips to repair the laceration. These tools bring the skin edges together to allow for faster healing and a better cosmetic outcome. If needed, you may also be given a tetanus shot. HOME CARE INSTRUCTIONS  Only take over-the-counter or prescription medicines as directed by your health care provider.  Follow your health care provider's instructions for wound care. These instructions will vary depending on the technique used for closing the wound. For Sutures:  Keep the wound clean and dry.   If you were given a bandage (dressing), you should change it at least once a day. Also change the dressing if it becomes wet or dirty, or as directed by your health care provider.   Wash the wound with soap and water 2 times a day. Rinse the wound off with water to remove all soap. Pat the wound dry with a clean towel.   After cleaning, apply a thin layer of the antibiotic ointment recommended by your health care provider. This will help prevent infection and keep the dressing from sticking.   You may shower as usual after the first  24 hours. Do not soak the wound in water until the sutures are removed.   Get your sutures removed as directed by your health care provider. With facial lacerations, sutures should usually be taken out after 4-5 days to avoid stitch marks.   Wait a few days after your sutures are removed before applying any makeup. For Skin Adhesive Strips:  Keep the wound clean and dry.   Do not get the skin adhesive strips wet. You may bathe carefully, using caution to keep the wound dry.   If the wound gets wet, pat it dry with a clean towel.   Skin adhesive strips will fall off on their own. You may trim the strips as the wound heals. Do not remove skin adhesive strips that are still stuck to the wound. They will fall off in time.  For Wound Adhesive:  You may briefly wet your wound in the shower or bath. Do not soak or scrub the wound. Do not swim. Avoid periods of heavy sweating until the skin adhesive has fallen off on its own. After showering or bathing, gently pat the wound dry with a clean towel.   Do not apply liquid medicine, cream medicine, ointment medicine, or makeup to your wound while the skin adhesive is in place. This may loosen the film before your wound is healed.   If a dressing is placed over the wound, be careful not to apply tape directly over the skin adhesive. This may cause the adhesive to be pulled off before the  wound is healed.   Avoid prolonged exposure to sunlight or tanning lamps while the skin adhesive is in place.  The skin adhesive will usually remain in place for 5-10 days, then naturally fall off the skin. Do not pick at the adhesive film.  After Healing: Once the wound has healed, cover the wound with sunscreen during the day for 1 full year. This can help minimize scarring. Exposure to ultraviolet light in the first year will darken the scar. It can take 1-2 years for the scar to lose its redness and to heal completely.  SEEK IMMEDIATE MEDICAL CARE  IF:  You have redness, pain, or swelling around the wound.   You see ayellowish-white fluid (pus) coming from the wound.   You have chills or a fever.  MAKE SURE YOU:  Understand these instructions.  Will watch your condition.  Will get help right away if you are not doing well or get worse. Document Released: 04/21/2004 Document Revised: 01/02/2013 Document Reviewed: 10/25/2012 [facility] Patient Information 2015 [place], [place]. This information is not intended to replace advice given to you by your health care provider. Make sure you discuss any questions you have with your health care provider.

## 2014-01-04 NOTE — ED Notes (Signed)
I helped patient into gown after I took initial vitals. Pasking repeatidly stating she has insurance, when will I go home?, etc.

## 2014-01-04 NOTE — ED Notes (Signed)
Patient transported to CT 

## 2014-01-05 MED ORDER — QUETIAPINE FUMARATE 200 MG PO TABS
200.0000 mg | ORAL_TABLET | Freq: Every day | ORAL | Status: DC | PRN
  Filled 2014-01-05: qty 1

## 2014-01-05 MED ORDER — CLONAZEPAM 0.5 MG PO TABS
0.5000 mg | ORAL_TABLET | Freq: Two times a day (BID) | ORAL | Status: DC | PRN
  Filled 2014-01-05: qty 1

## 2014-01-05 NOTE — ED Notes (Signed)
[facility] here to transport pt back to the nursing home.

## 2014-01-05 NOTE — ED Notes (Signed)
Report called to staff and notified them that pt will return home once transport is available

## 2014-01-17 ENCOUNTER — Emergency Department (HOSPITAL_BASED_OUTPATIENT_CLINIC_OR_DEPARTMENT_OTHER): Payer: Medicare Other

## 2014-01-17 ENCOUNTER — Emergency Department (HOSPITAL_BASED_OUTPATIENT_CLINIC_OR_DEPARTMENT_OTHER)
Admission: EM | Admit: 2014-01-17 | Discharge: 2014-01-18 | Disposition: A | Discharge status: Other Acute Inpatient Hospital | Payer: Medicare Other | Attending: Emergency Medicine | Admitting: Emergency Medicine

## 2014-01-17 ENCOUNTER — Encounter (HOSPITAL_BASED_OUTPATIENT_CLINIC_OR_DEPARTMENT_OTHER): Payer: Self-pay | Admitting: Emergency Medicine

## 2014-01-17 DIAGNOSIS — R531 Weakness: Secondary | ICD-10-CM | POA: Diagnosis present

## 2014-01-17 DIAGNOSIS — Z8669 Personal history of other diseases of the nervous system and sense organs: Secondary | ICD-10-CM | POA: Diagnosis not present

## 2014-01-17 DIAGNOSIS — Z792 Long term (current) use of antibiotics: Secondary | ICD-10-CM | POA: Insufficient documentation

## 2014-01-17 DIAGNOSIS — Z85828 Personal history of other malignant neoplasm of skin: Secondary | ICD-10-CM | POA: Insufficient documentation

## 2014-01-17 DIAGNOSIS — F419 Anxiety disorder, unspecified: Secondary | ICD-10-CM | POA: Insufficient documentation

## 2014-01-17 DIAGNOSIS — Z7982 Long term (current) use of aspirin: Secondary | ICD-10-CM | POA: Insufficient documentation

## 2014-01-17 DIAGNOSIS — F0391 Unspecified dementia with behavioral disturbance: Secondary | ICD-10-CM | POA: Insufficient documentation

## 2014-01-17 DIAGNOSIS — Z8673 Personal history of transient ischemic attack (TIA), and cerebral infarction without residual deficits: Secondary | ICD-10-CM | POA: Insufficient documentation

## 2014-01-17 DIAGNOSIS — R55 Syncope and collapse: Secondary | ICD-10-CM | POA: Insufficient documentation

## 2014-01-17 DIAGNOSIS — R7989 Other specified abnormal findings of blood chemistry: Secondary | ICD-10-CM

## 2014-01-17 DIAGNOSIS — R778 Other specified abnormalities of plasma proteins: Secondary | ICD-10-CM

## 2014-01-17 LAB — COMPREHENSIVE METABOLIC PANEL
ALBUMIN: 3.1 g/dL — AB (ref 3.5–5.2)
ALT: 9 U/L (ref 0–35)
AST: 18 U/L (ref 0–37)
Alkaline Phosphatase: 101 U/L (ref 39–117)
Anion gap: 13 (ref 5–15)
BUN: 12 mg/dL (ref 6–23)
CO2: 29 meq/L (ref 19–32)
Calcium: 9 mg/dL (ref 8.4–10.5)
Chloride: 101 mEq/L (ref 96–112)
Creatinine, Ser: 0.8 mg/dL (ref 0.50–1.10)
GFR calc Af Amer: 75 mL/min — ABNORMAL LOW (ref >90)
GFR, EST NON AFRICAN AMERICAN: 64 mL/min — AB (ref >90)
Glucose, Bld: 145 mg/dL — ABNORMAL HIGH (ref 70–99)
POTASSIUM: 4.6 meq/L (ref 3.7–5.3)
SODIUM: 143 meq/L (ref 137–147)
Total Bilirubin: <0.2 mg/dL — ABNORMAL LOW (ref 0.3–1.2)
Total Protein: 6.5 g/dL (ref 6.0–8.3)

## 2014-01-17 LAB — URINALYSIS, ROUTINE W REFLEX MICROSCOPIC
BILIRUBIN URINE: NEGATIVE
GLUCOSE, UA: NEGATIVE mg/dL
Hgb urine dipstick: NEGATIVE
KETONES UR: NEGATIVE mg/dL
Nitrite: NEGATIVE
Protein, ur: NEGATIVE mg/dL
SPECIFIC GRAVITY, URINE: 1.007 (ref 1.005–1.030)
Urobilinogen, UA: 1 mg/dL (ref 0.0–1.0)
pH: 7 (ref 5.0–8.0)

## 2014-01-17 LAB — TROPONIN I: Troponin I: 0.31 ng/mL (ref <0.30)

## 2014-01-17 LAB — CBC WITH DIFFERENTIAL/PLATELET
Basophils Absolute: 0 10*3/uL (ref 0.0–0.1)
Basophils Relative: 0 % (ref 0–1)
Eosinophils Absolute: 0 10*3/uL (ref 0.0–0.7)
Eosinophils Relative: 1 % (ref 0–5)
HEMATOCRIT: 39.5 % (ref 36.0–46.0)
Hemoglobin: 12.6 g/dL (ref 12.0–15.0)
LYMPHS PCT: 14 % (ref 12–46)
Lymphs Abs: 0.9 10*3/uL (ref 0.7–4.0)
MCH: 32.1 pg (ref 26.0–34.0)
MCHC: 31.9 g/dL (ref 30.0–36.0)
MCV: 100.8 fL — ABNORMAL HIGH (ref 78.0–100.0)
Monocytes Absolute: 0.4 10*3/uL (ref 0.1–1.0)
Monocytes Relative: 6 % (ref 3–12)
NEUTROS ABS: 5.4 10*3/uL (ref 1.7–7.7)
NEUTROS PCT: 79 % — AB (ref 43–77)
Platelets: 240 10*3/uL (ref 150–400)
RBC: 3.92 MIL/uL (ref 3.87–5.11)
RDW: 12.5 % (ref 11.5–15.5)
WBC: 6.7 10*3/uL (ref 4.0–10.5)

## 2014-01-17 LAB — URINE MICROSCOPIC-ADD ON

## 2014-01-17 MED ORDER — ENOXAPARIN SODIUM 60 MG/0.6ML ~~LOC~~ SOLN
1.0000 mg/kg | Freq: Once | SUBCUTANEOUS | Status: AC
  Administered 2014-01-17: 50 mg via SUBCUTANEOUS
  Filled 2014-01-17: qty 0.6

## 2014-01-17 MED ORDER — SODIUM CHLORIDE 0.9 % IV BOLUS (SEPSIS)
500.0000 mL | Freq: Once | INTRAVENOUS | Status: AC
  Administered 2014-01-17: 500 mL via INTRAVENOUS

## 2014-01-17 NOTE — ED Notes (Signed)
Report given to [facility], enroute to transport pt

## 2014-01-17 NOTE — ED Notes (Signed)
Returned from xray

## 2014-01-17 NOTE — ED Notes (Signed)
Called [facility] nurse sup Megan, stated she was working on plans for pt would call me back with information

## 2014-01-17 NOTE — ED Notes (Signed)
Paged [facility] x2 at [phone]

## 2014-01-17 NOTE — ED Notes (Signed)
Attempted to contact pt power of attorney, Jori Moll, no answer x 2 at phone [phone]

## 2014-01-17 NOTE — ED Provider Notes (Signed)
CSN: [id number]     Arrival date & time 01/17/14  1802 History  This chart was scribed for Christina Pert, MD by Starleen Arms, ED Scribe. This patient was seen in room MH05/MH05 and the patient's care was started at 6:46 PM.   Chief Complaint  Patient presents with  . Weakness   The history is provided by the patient. No language interpreter was used.   HPI Comments: Level 5 caveat due to dementia.  Christina Summers is a 80 y.o. female with a history of dementia brought in by ambulance, who presents to the Emergency Department complaining of syncope onset 1 hour ago.  Per EMS, patient lives at [facility] in the memory care unit where she reportedly nodded off during dinner, potentially having a syncopal event.  However, she was alert when EMS arrived.  Patient denies any pain.  Patient is a poor historian and repeatedly states "I am fine" and "I want to go home".  Past Medical History  Diagnosis Date  . Insomnia, unspecified   . Hard of hearing 06/25/2012  . Stroke     tia  . Cancer     skin  . Dementia   . Dementia with behavioral disturbance 06/25/2012  . Anxiety    Past Surgical History  Procedure Laterality Date  . No past surgeries    . Excision melanoma with sentinel lymph node biopsy Left 10/07/2013    Procedure:  LEFT UPPER EXTREMITY EXCISIONAL BIOPSY;  Surgeon: Ralene Ok, MD;  Location: [facility];  Service: General;  Laterality: Left;   Family History  Problem Relation Age of Onset  . Other Mother   . Other Father   . Heart attack Father    History  Substance Use Topics  . Smoking status: Never Smoker   . Smokeless tobacco: Never Used  . Alcohol Use: No   OB History   Grav Para Term Preterm Abortions TAB SAB Ect Mult Living                 Review of Systems  Constitutional: Negative for appetite change and fatigue.  HENT: Negative for congestion, ear discharge and sinus pressure.   Eyes: Negative for discharge.  Respiratory: Negative for cough.    Gastrointestinal: Negative for diarrhea.  Genitourinary: Negative for frequency and hematuria.  Musculoskeletal: Negative for back pain.  Skin: Negative for rash.  Neurological: Positive for syncope. Negative for seizures and numbness.  Psychiatric/Behavioral: Negative for behavioral problems.      Allergies  Review of patient's allergies indicates no known allergies.  Home Medications   Prior to Admission medications   Medication Sig Start Date End Date Taking? Authorizing Provider  aspirin 325 MG tablet Take 1 tablet (325 mg total) by mouth daily. 07/18/12   Ripudeep Krystal Eaton, MD  clonazePAM (KLONOPIN) 0.5 MG tablet Take 0.5 mg by mouth 2 (two) times daily as needed for anxiety.    Historical Provider, MD  donepezil (ARICEPT) 10 MG tablet Take 1 tablet (10 mg total) by mouth daily. 07/29/13   Dennie Bible, NP  doxycycline (VIBRAMYCIN) 100 MG capsule Take 100 mg by mouth 2 (two) times daily. Daughter states she is taking for 10 days only started medication on 09-26-13    Historical Provider, MD  oxyCODONE-acetaminophen (ROXICET) 5-325 MG per tablet Take 1-2 tablets by mouth every 4 (four) hours as needed. 10/07/13   Ralene Ok, MD  QUEtiapine (SEROQUEL) 200 MG tablet Take 200 mg by mouth daily as needed (at bedtime).  Historical Provider, MD  risperiDONE (RISPERDAL) 2 MG tablet 1-2 tabs qhs, one tab qam. In replace of seroquel 07/26/13   Marcial Pacas, MD   BP 115/55  Pulse 78  Temp(Src) 98.1 F (36.7 C) (Oral)  Resp 16  Wt 105 lb (47.628 kg)  SpO2 96% Physical Exam  Nursing note and vitals reviewed. Constitutional: She appears well-developed and well-nourished.  Non-toxic appearance. She does not appear ill. No distress.  HENT:  Head: Normocephalic and atraumatic.  Right Ear: External ear normal.  Left Ear: External ear normal.  Nose: Nose normal. No mucosal edema or rhinorrhea.  Mouth/Throat: Oropharynx is clear and moist and mucous membranes are normal. No dental  abscesses or uvula swelling.  Eyes: Conjunctivae and EOM are normal. Pupils are equal, round, and reactive to light.  Neck: Normal range of motion and full passive range of motion without pain. Neck supple.  Cardiovascular: Normal rate, regular rhythm and normal heart sounds.  Exam reveals no gallop and no friction rub.   No murmur heard. Pulmonary/Chest: Effort normal and breath sounds normal. No respiratory distress. She has no wheezes. She has no rhonchi. She has no rales. She exhibits no tenderness and no crepitus.  Abdominal: Soft. Normal appearance and bowel sounds are normal. She exhibits no distension. There is no tenderness. There is no rebound and no guarding.  Musculoskeletal: Normal range of motion. She exhibits no edema and no tenderness.  Moves all extremities well.   Neurological: She is alert. She has normal strength. No cranial nerve deficit.  alert, oriented x2, does not know year speech: normal in context and clarity memory: intact grossly cranial nerves II-XII: intact motor strength: full proximally and distally sensation: intact to light touch diffusely  cerebellar: finger-to-nose intact bilaterally gait: deferred  Skin: Skin is warm, dry and intact. No rash noted. No erythema. No pallor.  Psychiatric: She has a normal mood and affect. Her speech is normal and behavior is normal. Her mood appears not anxious.    ED Course  Procedures (including critical care time)  DIAGNOSTIC STUDIES: Oxygen Saturation is 96% on RA, normal by my interpretation.    COORDINATION OF CARE:  7:06 PM Discussed treatment plan with patient at bedside.  Patient acknowledges and agrees with plan.     Labs Review Labs Reviewed  CBC WITH DIFFERENTIAL - Abnormal; Notable for the following:    MCV 100.8 (*)    Neutrophils Relative % 79 (*)    All other components within normal limits  COMPREHENSIVE METABOLIC PANEL - Abnormal; Notable for the following:    Glucose, Bld 145 (*)     Albumin 3.1 (*)    Total Bilirubin <0.2 (*)    GFR calc non Af Amer 64 (*)    GFR calc Af Amer 75 (*)    All other components within normal limits  TROPONIN I - Abnormal; Notable for the following:    Troponin I 0.31 (*)    All other components within normal limits  URINALYSIS, ROUTINE W REFLEX MICROSCOPIC - Abnormal; Notable for the following:    APPearance CLOUDY (*)    Leukocytes, UA TRACE (*)    All other components within normal limits  URINE MICROSCOPIC-ADD ON - Abnormal; Notable for the following:    Bacteria, UA FEW (*)    All other components within normal limits    Imaging Review Dg Chest 2 View  01/17/2014   CLINICAL DATA:  Possible syncope.  Dementia.  EXAM: CHEST  2 VIEW  COMPARISON:  10/03/2013  FINDINGS: Bibasilar airspace opacities are noted which could represent atelectasis or infiltrates. Heart is borderline in size. Mild hyperinflation of the lungs. No effusions. No acute bony abnormality. Mild wedge deformity of a mid to lower thoracic vertebral body, stable since prior study.  IMPRESSION: Mild hyperinflation.  Bibasilar atelectasis or pneumonia.   Electronically Signed   By: Rolm Baptise M.D.   On: 01/17/2014 19:56     EKG Interpretation   Date/Time:  Friday January 17 2014 19:34:20 EDT Ventricular Rate:  79 PR Interval:  136 QRS Duration: 86 QT Interval:  408 QTC Calculation: 467 R Axis:   12 Text Interpretation:  Normal sinus rhythm Anterior infarct , age  undetermined ST \\T \ T wave abnormality, consider lateral ischemia LVH in  V1-V3 no longer seen  Confirmed by Kerly Rigsbee  MD, Uziah Sorter ([id number]) on  01/17/2014 7:48:30 PM     CRITICAL CARE Performed by: Christina Summers, S Total critical care time: 30 min Critical care time was exclusive of separately billable procedures and treating other patients. Critical care was necessary to treat or prevent imminent or life-threatening deterioration. Critical care was time spent personally by me on the following  activities: development of treatment plan with patient and/or surrogate as well as nursing, discussions with consultants, evaluation of patient's response to treatment, examination of patient, obtaining history from patient or surrogate, ordering and performing treatments and interventions, ordering and review of laboratory studies, ordering and review of radiographic studies, pulse oximetry and re-evaluation of patient's condition.  MDM   Final diagnoses:  Syncope  Elevated troponin    9:12 PM 80 y.o. female with a history of dementia who presents after potential syncopal episode which occurred at dinner. I called and spoke with a nurse at her facility  [facility]) who stated that the patient appeared to syncopize at dinner and was unresponsive for approximately 15 minutes. She was sitting down at the time of the syncopal episode. She states that the patient appeared pale at that time. On my exam the patient states that she wants to leave and will not answer most questions. She will not tell me if she is having pain anywhere. She has no current complaints except that she wants to leave. Her vital signs are unremarkable here.Pt is at mental baseline per nurse who I spoke w/ at her facility.   The patient was found to have a mildly elevated troponin. Her ecg was unchanged. I discussed the case with Dr. Atilano Median at [facility] who recommended admission to the hospitalist service. He also recommended a dose of Lovenox which will order. The patient continues to appear well on exam.  9:25 PM Discussed case w/ Dr. Florene Glen. Will admit to [facility].    I personally performed the services described in this documentation, which was scribed in my presence. The recorded information has been reviewed and is accurate.    Christina Pert, MD 01/17/14 [phone]

## 2014-01-17 NOTE — ED Notes (Signed)
Pt transported to xray 

## 2014-01-17 NOTE — ED Notes (Signed)
MD at bedside. 

## 2014-01-17 NOTE — ED Notes (Signed)
Pt incontinent of urine, pt changed, pericare. Pt continues to try to get out of bed, pulling on her monitor leads and pulled out her iv. Pt moved in [place] chair in the nursing station for closer observation for safety, awaiting disposition.

## 2014-01-17 NOTE — ED Notes (Signed)
Placed call to [facility] rep spoke with crystal states truck will be here asap. ems canceled

## 2014-01-17 NOTE — ED Notes (Signed)
EDP notified elevated trop 0.31, no new orders given at this time

## 2014-01-17 NOTE — ED Notes (Signed)
Spoke with EMS rep Crystal, stated that she was put in call and truck will be here as soon as possible

## 2014-01-17 NOTE — ED Notes (Signed)
Pt from [place] memory care unit, called out for ?syncope. Pt reports that she wants to go home.  Pt was alert when EMS arrived. Reports pt fell asleep at table.

## 2014-01-18 DIAGNOSIS — Z792 Long term (current) use of antibiotics: Secondary | ICD-10-CM | POA: Diagnosis not present

## 2014-01-18 DIAGNOSIS — R531 Weakness: Secondary | ICD-10-CM | POA: Diagnosis present

## 2014-01-18 DIAGNOSIS — Z8673 Personal history of transient ischemic attack (TIA), and cerebral infarction without residual deficits: Secondary | ICD-10-CM | POA: Diagnosis not present

## 2014-01-18 DIAGNOSIS — Z8669 Personal history of other diseases of the nervous system and sense organs: Secondary | ICD-10-CM | POA: Diagnosis not present

## 2014-01-18 DIAGNOSIS — F419 Anxiety disorder, unspecified: Secondary | ICD-10-CM | POA: Diagnosis not present

## 2014-01-18 DIAGNOSIS — F0391 Unspecified dementia with behavioral disturbance: Secondary | ICD-10-CM | POA: Diagnosis not present

## 2014-01-18 DIAGNOSIS — Z7982 Long term (current) use of aspirin: Secondary | ICD-10-CM | POA: Diagnosis not present

## 2014-01-18 DIAGNOSIS — R55 Syncope and collapse: Secondary | ICD-10-CM | POA: Diagnosis not present

## 2014-01-18 DIAGNOSIS — Z85828 Personal history of other malignant neoplasm of skin: Secondary | ICD-10-CM | POA: Diagnosis not present

## 2014-01-30 ENCOUNTER — Ambulatory Visit: Payer: Medicare Other | Admitting: Neurology

## 2014-02-05 ENCOUNTER — Encounter: Payer: Self-pay | Admitting: Neurology

## 2014-02-12 ENCOUNTER — Encounter: Payer: Self-pay | Admitting: Neurology

## 2014-02-18 ENCOUNTER — Encounter: Payer: Self-pay | Admitting: Neurology

## 2014-03-05 ENCOUNTER — Other Ambulatory Visit (HOSPITAL_COMMUNITY): Payer: Self-pay | Admitting: Geriatric Medicine

## 2014-03-05 DIAGNOSIS — R1310 Dysphagia, unspecified: Secondary | ICD-10-CM

## 2014-03-06 ENCOUNTER — Ambulatory Visit (HOSPITAL_COMMUNITY): Admission: RE | Admit: 2014-03-06 | Payer: Medicare Other | Source: Ambulatory Visit

## 2014-03-06 ENCOUNTER — Ambulatory Visit (HOSPITAL_COMMUNITY)
Admission: RE | Admit: 2014-03-06 | Discharge: 2014-03-06 | Disposition: A | Discharge status: Home / Self Care | Payer: Medicare Other | Source: Ambulatory Visit | Attending: Geriatric Medicine | Admitting: Geriatric Medicine

## 2014-03-10 ENCOUNTER — Ambulatory Visit (HOSPITAL_COMMUNITY)
Admission: RE | Admit: 2014-03-10 | Discharge: 2014-03-10 | Disposition: A | Discharge status: Home / Self Care | Payer: Medicare Other | Source: Ambulatory Visit | Attending: Geriatric Medicine | Admitting: Geriatric Medicine

## 2014-03-10 DIAGNOSIS — Z9181 History of falling: Secondary | ICD-10-CM | POA: Diagnosis not present

## 2014-03-10 DIAGNOSIS — F0391 Unspecified dementia with behavioral disturbance: Secondary | ICD-10-CM | POA: Diagnosis not present

## 2014-03-10 DIAGNOSIS — Z9183 Wandering in diseases classified elsewhere: Secondary | ICD-10-CM | POA: Insufficient documentation

## 2014-03-10 DIAGNOSIS — Z8673 Personal history of transient ischemic attack (TIA), and cerebral infarction without residual deficits: Secondary | ICD-10-CM | POA: Diagnosis not present

## 2014-03-10 DIAGNOSIS — R1312 Dysphagia, oropharyngeal phase: Secondary | ICD-10-CM | POA: Diagnosis present

## 2014-03-10 DIAGNOSIS — F419 Anxiety disorder, unspecified: Secondary | ICD-10-CM | POA: Insufficient documentation

## 2014-03-10 DIAGNOSIS — R1310 Dysphagia, unspecified: Secondary | ICD-10-CM

## 2014-03-10 NOTE — Procedures (Signed)
Objective Swallowing Evaluation: Modified Barium Swallowing Study  Patient Details  Name: Christina Summers MRN: [id number] Date of Birth: [birth year]  Today's Date: 03/10/2014 Time: 1400-1415 SLP Time Calculation (min) (ACUTE ONLY): 15 min  Past Medical History:  Past Medical History  Diagnosis Date  . Insomnia, unspecified   . Hard of hearing 06/25/2012  . Stroke     tia  . Cancer     skin  . Dementia   . Dementia with behavioral disturbance 06/25/2012  . Anxiety    Past Surgical History:  Past Surgical History  Procedure Laterality Date  . No past surgeries    . Excision melanoma with sentinel lymph node biopsy Left 10/07/2013    Procedure:  LEFT UPPER EXTREMITY EXCISIONAL BIOPSY;  Surgeon: Ralene Ok, MD;  Location: [place];  Service: General;  Laterality: Left;   HPI:  80 yo female referred for MBS due to concerns she may be aspirating.  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  Symptoms included on referral form include coughing during meals and increased chest congestion.       Assessment / Plan / Recommendation Clinical Impression  Dysphagia Diagnosis: Within Functional Limits Clinical impression: Pt with functional oropharyngeal swallow ability without aspiration or deep laryngeal penetration of any consistency tested.  Pt was impulsive during testing and became mildly agitated but participated throughout entire test.  She fed herself during testing and would consume large boluses of thin liquids.  Trace upper laryngeal penetration of thin cleared with cued throat clearing.   Pt with only minimal vallecular residuals that clear with furhter reflexive swallow.   No choking/coughing observed during testing.  Recommend continue regular/thin diet with precautions.      Treatment Recommendation  Defer treatment plan to SLP at (Comment)    Diet  Recommendation Regular;Thin liquid   Liquid Administration via: Cup;Straw (consider using provale bolus flow control cup if eliminates coughing) Medication Administration:  (defer to referring slp) Supervision: Patient able to self feed Compensations: Slow rate;Small sips/bites Postural Changes and/or Swallow Maneuvers: Seated upright 90 degrees;Upright 30-60 min after meal    Other  Recommendations Oral Care Recommendations: Oral care BID   Follow Up Recommendations   (defer to referring slp)      General Date of Onset: 03/10/14 HPI: 80 yo female referred for MBS due to concerns she may be aspirating.  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  Symptoms included on referral form include coughing during meals and increased chest congestion.   Type of Study: Modified Barium Swallowing Study Reason for Referral: Objectively evaluate swallowing function Diet Prior to this Study: Regular;Thin liquids Temperature Spikes Noted: No Respiratory Status: Room air History of Recent Intubation: No Behavior/Cognition: Alert;Cooperative;Pleasant mood Oral Cavity - Dentition: Adequate natural dentition Oral Motor / Sensory Function: Within functional limits Self-Feeding Abilities: Able to feed self Patient Positioning: Upright in bed Baseline Vocal Quality: Clear Volitional Cough: Strong Volitional Swallow: Able to elicit Anatomy: Within functional limits Pharyngeal Secretions: Not observed secondary MBS    Reason for Referral Objectively evaluate swallowing function   Oral Phase Oral Preparation/Oral Phase Oral Phase: Impaired Oral - Nectar Oral - Nectar Cup: Within functional limits Oral - Thin Oral - Thin Cup: Within functional limits Oral - Thin Straw: Within functional limits Oral - Solids Oral -  Puree: Piecemeal swallowing Oral - Regular: Piecemeal swallowing Oral  - Pill: Piecemeal swallowing (pt masticated barium tablet after she did not orally transit with pudding or thin liquids)   Pharyngeal Phase Pharyngeal Phase Pharyngeal Phase: Impaired Pharyngeal - Nectar Pharyngeal - Nectar Cup: Pharyngeal residue - valleculae Pharyngeal - Thin Pharyngeal - Thin Cup: Pharyngeal residue - valleculae;Penetration/Aspiration during swallow Penetration/Aspiration details (thin cup): Material enters airway, remains ABOVE vocal cords and not ejected out Pharyngeal - Thin Straw: Pharyngeal residue - valleculae;Penetration/Aspiration during swallow Penetration/Aspiration details (thin straw): Material enters airway, remains ABOVE vocal cords and not ejected out Pharyngeal - Solids Pharyngeal - Puree: Pharyngeal residue - valleculae Pharyngeal - Regular: Pharyngeal residue - valleculae Pharyngeal - Pill: Within functional limits Pharyngeal Phase - Comment Pharyngeal Comment: trace to mild residuals in pharynx noted that clear with further swallows, cued throat clearing effective to clear trace penetration, no aspiration observed - nor did pt cough/choke during evaluation  Cervical Esophageal Phase    GO    Cervical Esophageal Phase Cervical Esophageal Phase: [facility]    Functional Assessment Tool Used: mbs. clinical judgement Functional Limitations: Swallowing Swallow Current Status (Z5638): At least 1 percent but less than 20 percent impaired, limited or restricted Swallow Goal Status [phone]): At least 1 percent but less than 20 percent impaired, limited or restricted Swallow Discharge Status [phone]): At least 1 percent but less than 20 percent impaired, limited or restricted    Christina Summers, [facility] SLP [phone]

## 2014-03-13 ENCOUNTER — Ambulatory Visit (HOSPITAL_COMMUNITY): Payer: Medicare Other

## 2014-11-15 ENCOUNTER — Encounter (HOSPITAL_BASED_OUTPATIENT_CLINIC_OR_DEPARTMENT_OTHER): Payer: Self-pay | Admitting: Emergency Medicine

## 2014-11-15 ENCOUNTER — Emergency Department (HOSPITAL_BASED_OUTPATIENT_CLINIC_OR_DEPARTMENT_OTHER)
Admission: EM | Admit: 2014-11-15 | Discharge: 2014-11-15 | Disposition: A | Discharge status: Home / Self Care | Payer: Medicare Other | Attending: Emergency Medicine | Admitting: Emergency Medicine

## 2014-11-15 DIAGNOSIS — Z8673 Personal history of transient ischemic attack (TIA), and cerebral infarction without residual deficits: Secondary | ICD-10-CM | POA: Insufficient documentation

## 2014-11-15 DIAGNOSIS — H9190 Unspecified hearing loss, unspecified ear: Secondary | ICD-10-CM | POA: Insufficient documentation

## 2014-11-15 DIAGNOSIS — F419 Anxiety disorder, unspecified: Secondary | ICD-10-CM | POA: Insufficient documentation

## 2014-11-15 DIAGNOSIS — Z7982 Long term (current) use of aspirin: Secondary | ICD-10-CM | POA: Insufficient documentation

## 2014-11-15 DIAGNOSIS — Z85828 Personal history of other malignant neoplasm of skin: Secondary | ICD-10-CM | POA: Insufficient documentation

## 2014-11-15 DIAGNOSIS — Y990 Civilian activity done for income or pay: Secondary | ICD-10-CM | POA: Diagnosis not present

## 2014-11-15 DIAGNOSIS — W01198A Fall on same level from slipping, tripping and stumbling with subsequent striking against other object, initial encounter: Secondary | ICD-10-CM | POA: Insufficient documentation

## 2014-11-15 DIAGNOSIS — Z79899 Other long term (current) drug therapy: Secondary | ICD-10-CM | POA: Insufficient documentation

## 2014-11-15 DIAGNOSIS — S0990XA Unspecified injury of head, initial encounter: Secondary | ICD-10-CM | POA: Diagnosis present

## 2014-11-15 DIAGNOSIS — Y92128 Other place in nursing home as the place of occurrence of the external cause: Secondary | ICD-10-CM | POA: Diagnosis not present

## 2014-11-15 DIAGNOSIS — F0390 Unspecified dementia without behavioral disturbance: Secondary | ICD-10-CM | POA: Insufficient documentation

## 2014-11-15 DIAGNOSIS — Y9301 Activity, walking, marching and hiking: Secondary | ICD-10-CM | POA: Diagnosis not present

## 2014-11-15 DIAGNOSIS — G4700 Insomnia, unspecified: Secondary | ICD-10-CM | POA: Diagnosis not present

## 2014-11-15 DIAGNOSIS — Y92129 Unspecified place in nursing home as the place of occurrence of the external cause: Secondary | ICD-10-CM

## 2014-11-15 DIAGNOSIS — W19XXXA Unspecified fall, initial encounter: Secondary | ICD-10-CM

## 2014-11-15 NOTE — ED Notes (Signed)
Pt tripped while walking down the hall at [facility] and hit the left side of her head on the wall.  Staff caught her so she didn't fall.  Skin is intact. No swelling or deformity.  Policy of nsg home is to send her to Ed for eval. No obvious injury.  Pt denies pain.

## 2014-11-15 NOTE — ED Provider Notes (Signed)
CSN: [id number]     Arrival date & time 11/15/14  0012 History   First MD Initiated Contact with Patient 11/15/14 0046     Chief Complaint  Patient presents with  . Head Injury     (Consider location/radiation/quality/duration/timing/severity/associated sxs/prior Treatment) HPI  Level 5 Caveat: dementia; uncooperative This is an 81 year old female with a history of dementia. She was at her living facility and almost fell but the wall stopped her. She leaned against the wall mildly striking her the left side of her head. Staff prevented her from falling to the ground. There is no swelling associated. The patient is angry and demanding to go home.  Past Medical History  Diagnosis Date  . Insomnia, unspecified   . Hard of hearing 06/25/2012  . Stroke     tia  . Cancer     skin  . Dementia   . Dementia with behavioral disturbance 06/25/2012  . Anxiety    Past Surgical History  Procedure Laterality Date  . No past surgeries    . Excision melanoma with sentinel lymph node biopsy Left 10/07/2013    Procedure:  LEFT UPPER EXTREMITY EXCISIONAL BIOPSY;  Surgeon: Ralene Ok, MD;  Location: [place];  Service: General;  Laterality: Left;   Family History  Problem Relation Age of Onset  . Other Mother   . Other Father   . Heart attack Father    Social History  Substance Use Topics  . Smoking status: Never Smoker   . Smokeless tobacco: Never Used  . Alcohol Use: No   OB History    No data available     Review of Systems  Unable to perform ROS   Allergies  Review of patient's allergies indicates no known allergies.  Home Medications   Prior to Admission medications   Medication Sig Start Date End Date Taking? Authorizing Provider  divalproex (DEPAKOTE SPRINKLE) 125 MG capsule Take 125 mg by mouth 2 (two) times daily.   Yes Historical Provider, MD  aspirin 325 MG tablet Take 1 tablet (325 mg total) by mouth daily. 07/18/12   Ripudeep Krystal Eaton, MD  clonazePAM (KLONOPIN) 0.5 MG  tablet Take 0.5 mg by mouth 2 (two) times daily as needed for anxiety.    Historical Provider, MD  donepezil (ARICEPT) 10 MG tablet Take 1 tablet (10 mg total) by mouth daily. 07/29/13   Dennie Bible, NP  doxycycline (VIBRAMYCIN) 100 MG capsule Take 100 mg by mouth 2 (two) times daily. Daughter states she is taking for 10 days only started medication on 09-26-13    Historical Provider, MD  oxyCODONE-acetaminophen (ROXICET) 5-325 MG per tablet Take 1-2 tablets by mouth every 4 (four) hours as needed. 10/07/13   Ralene Ok, MD  QUEtiapine (SEROQUEL) 200 MG tablet Take 200 mg by mouth daily as needed (at bedtime).     Historical Provider, MD  risperiDONE (RISPERDAL) 2 MG tablet 1-2 tabs qhs, one tab qam. In replace of seroquel 07/26/13   Marcial Pacas, MD   BP 147/66 mmHg  Pulse 66  Temp(Src) 98.2 F (36.8 C) (Oral)  Resp 16  Wt 110 lb (49.896 kg)  SpO2 95%   Physical Exam  General: Well-developed, well-nourished female in no acute distress; appearance consistent with age of record HENT: normocephalic; no scalp hematoma palpated; no hemotympanum Eyes: pupils equal, round and reactive to light Neck: supple without apparent pain on range of motion Heart: regular rate and rhythm Lungs: clear to auscultation bilaterally Abdomen: soft; nondistended; nontender; bowel  sounds present Extremities: Arthritic changes; no acute injury seen; pulses normal Neurologic: Awake, alert; motor function intact in all extremities and symmetric; no facial droop Skin: Warm and dry Psychiatric: Angry mood with congruent affect; argumentative and demanding to go home    ED Course  Procedures (including critical care time)   MDM  No evidence of significant injury found on exam. She is not on anticoagulation's.   Shanon Rosser, MD 11/15/14 [phone]

## 2014-11-15 NOTE — ED Notes (Signed)
Pt transported back to [facility] via [place]

## 2014-11-15 NOTE — ED Notes (Signed)
Pt placed in geri chair at nurses station for monitoring. Warm blankets provided

## 2014-11-15 NOTE — ED Notes (Signed)
Pt bed alarm sounded, upon staff entering room pt was still in the bed in NAD. Dr. Florina Ou entered room to assess pt. No acute findings. Disposition set for discharge.

## 2015-04-07 ENCOUNTER — Emergency Department (HOSPITAL_BASED_OUTPATIENT_CLINIC_OR_DEPARTMENT_OTHER): Payer: Medicare Other

## 2015-04-07 ENCOUNTER — Encounter (HOSPITAL_BASED_OUTPATIENT_CLINIC_OR_DEPARTMENT_OTHER): Payer: Self-pay | Admitting: *Deleted

## 2015-04-07 ENCOUNTER — Emergency Department (HOSPITAL_BASED_OUTPATIENT_CLINIC_OR_DEPARTMENT_OTHER)
Admission: EM | Admit: 2015-04-07 | Discharge: 2015-04-07 | Disposition: A | Discharge status: Home / Self Care | Payer: Medicare Other | Attending: Emergency Medicine | Admitting: Emergency Medicine

## 2015-04-07 DIAGNOSIS — F0390 Unspecified dementia without behavioral disturbance: Secondary | ICD-10-CM | POA: Diagnosis not present

## 2015-04-07 DIAGNOSIS — S0181XA Laceration without foreign body of other part of head, initial encounter: Secondary | ICD-10-CM | POA: Diagnosis not present

## 2015-04-07 DIAGNOSIS — Z7982 Long term (current) use of aspirin: Secondary | ICD-10-CM | POA: Diagnosis not present

## 2015-04-07 DIAGNOSIS — G4700 Insomnia, unspecified: Secondary | ICD-10-CM | POA: Insufficient documentation

## 2015-04-07 DIAGNOSIS — Y9289 Other specified places as the place of occurrence of the external cause: Secondary | ICD-10-CM | POA: Insufficient documentation

## 2015-04-07 DIAGNOSIS — Y998 Other external cause status: Secondary | ICD-10-CM | POA: Insufficient documentation

## 2015-04-07 DIAGNOSIS — H9190 Unspecified hearing loss, unspecified ear: Secondary | ICD-10-CM | POA: Insufficient documentation

## 2015-04-07 DIAGNOSIS — Z79899 Other long term (current) drug therapy: Secondary | ICD-10-CM | POA: Diagnosis not present

## 2015-04-07 DIAGNOSIS — Z8673 Personal history of transient ischemic attack (TIA), and cerebral infarction without residual deficits: Secondary | ICD-10-CM | POA: Diagnosis not present

## 2015-04-07 DIAGNOSIS — Y9389 Activity, other specified: Secondary | ICD-10-CM | POA: Diagnosis not present

## 2015-04-07 DIAGNOSIS — W1839XA Other fall on same level, initial encounter: Secondary | ICD-10-CM | POA: Insufficient documentation

## 2015-04-07 DIAGNOSIS — W19XXXA Unspecified fall, initial encounter: Secondary | ICD-10-CM

## 2015-04-07 NOTE — ED Notes (Signed)
Pt attempting out of bed multiple times. Unable to redirect pt. Pt placed in geri-chair and placed at nsg desk for close observation.

## 2015-04-07 NOTE — ED Notes (Signed)
Report given to [facility] at [facility] memory care.

## 2015-04-07 NOTE — Discharge Instructions (Signed)
Your evaluated in the ED today for your fall. There does not appear to be any emergent symptoms related to your fall. Your CT scan was negative. Your wound was cleaned and dressed in the emergency department. Please follow-up with your doctor as needed. Return to ED for any new or worsening symptoms.  Fall Prevention in Hospitals, Adult As a hospital patient, your condition and the treatments you receive can increase your risk for falls. Some additional risk factors for falls in a hospital include:  Being in an unfamiliar environment.  Being on bed rest.  Your surgery.  Taking certain medicines.  Your tubing requirements, such as intravenous (IV) therapy or catheters. It is important that you learn how to decrease fall risks while at the hospital. Below are important tips that can help prevent falls. SAFETY TIPS FOR PREVENTING FALLS Talk about your risk of falling.  Ask your health care provider why you are at risk for falling. Is it your medicine, illness, tubing placement, or something else?  Make a plan with your health care provider to keep you safe from falls.  Ask your health care provider or pharmacist about side effects of your medicines. Some medicines can make you dizzy or affect your coordination. Ask for help.  Ask for help before getting out of bed. You may need to press your call button.  Ask for assistance in getting safely to the toilet.  Ask for a walker or cane to be put at your bedside. Ask that most of the side rails on your bed be placed up before your health care provider leaves the room.  Ask family or friends to sit with you.  Ask for things that are out of your reach, such as your glasses, hearing aids, telephone, bedside table, or call button. Follow these tips to avoid falling:  Stay lying or seated, rather than standing, while waiting for help.  Wear rubber-soled slippers or shoes whenever you walk in the hospital.  Avoid quick, sudden  movements.  Change positions slowly.  Sit on the side of your bed before standing.  Stand up slowly and wait before you start to walk.  Let your health care provider know if there is a spill on the floor.  Pay careful attention to the medical equipment, electrical cords, and tubes around you.  When you need help, use your call button by your bed or in the bathroom. Wait for one of your health care providers to help you.  If you feel dizzy or unsure of your footing, return to bed and wait for assistance.  Avoid being distracted by the TV, telephone, or another person in your room.  Do not lean or support yourself on rolling objects, such as IV poles or bedside tables.   This information is not intended to replace advice given to you by your health care provider. Make sure you discuss any questions you have with your health care provider.   Document Released: 03/11/2000 Document Revised: 04/04/2014 Document Reviewed: 11/20/2011 Elsevier Interactive Patient Education [employer].

## 2015-04-07 NOTE — ED Notes (Signed)
Pt brought in by EMS from [facility] for unwitnessed fall lac over left eye

## 2015-04-07 NOTE — ED Notes (Signed)
PTAR notified for transport back to [facility] [place] [facility]

## 2015-04-07 NOTE — ED Provider Notes (Signed)
CSN: [id number]     Arrival date & time 04/07/15  1932 History   First MD Initiated Contact with Patient 04/07/15 1951     Chief Complaint  Patient presents with  . Fall   Level V caveat: Dementia  (Consider location/radiation/quality/duration/timing/severity/associated sxs/prior Treatment) HPI Magin D Hull is a 82 y.o. female with a history of dementia comes in for violation of fall. Patient is brought in by EMS from [facility] for unwitnessed fall and a laceration over her left eye. Per EMS and nursing staff, patient is at baseline. She does have dementia and is unable to provide any information.  Past Medical History  Diagnosis Date  . Insomnia, unspecified   . Hard of hearing 06/25/2012  . Stroke ([place])     tia  . Cancer ([place])     skin  . Dementia   . Dementia with behavioral disturbance 06/25/2012  . Anxiety    Past Surgical History  Procedure Laterality Date  . No past surgeries    . Excision melanoma with sentinel lymph node biopsy Left 10/07/2013    Procedure:  LEFT UPPER EXTREMITY EXCISIONAL BIOPSY;  Surgeon: Ralene Ok, MD;  Location: [place];  Service: General;  Laterality: Left;   Family History  Problem Relation Age of Onset  . Other Mother   . Other Father   . Heart attack Father    Social History  Substance Use Topics  . Smoking status: Never Smoker   . Smokeless tobacco: Never Used  . Alcohol Use: No   OB History    No data available     Review of Systems  Unable to perform ROS: Dementia      Allergies  Review of patient's allergies indicates no known allergies.  Home Medications   Prior to Admission medications   Medication Sig Start Date End Date Taking? Authorizing Provider  aspirin 325 MG tablet Take 1 tablet (325 mg total) by mouth daily. 07/18/12   Ripudeep Krystal Eaton, MD  clonazePAM (KLONOPIN) 0.5 MG tablet Take 0.5 mg by mouth 2 (two) times daily as needed for anxiety.    Historical Provider, MD  divalproex (DEPAKOTE  SPRINKLE) 125 MG capsule Take 125 mg by mouth 2 (two) times daily.    Historical Provider, MD  donepezil (ARICEPT) 10 MG tablet Take 1 tablet (10 mg total) by mouth daily. 07/29/13   Dennie Bible, NP  doxycycline (VIBRAMYCIN) 100 MG capsule Take 100 mg by mouth 2 (two) times daily. Daughter states she is taking for 10 days only started medication on 09-26-13    Historical Provider, MD  oxyCODONE-acetaminophen (ROXICET) 5-325 MG per tablet Take 1-2 tablets by mouth every 4 (four) hours as needed. 10/07/13   Ralene Ok, MD  risperiDONE (RISPERDAL) 2 MG tablet 1-2 tabs qhs, one tab qam. In replace of seroquel 07/26/13   Marcial Pacas, MD   BP 141/71 mmHg  Pulse 97  Temp(Src) 98.4 F (36.9 C) (Oral)  Resp 20  SpO2 93% Physical Exam  Constitutional: She appears well-developed and well-nourished.  HENT:  Head: Normocephalic and atraumatic.  Mouth/Throat: Oropharynx is clear and moist.  Very small skin abrasion to left lateral eyebrow  Eyes: Conjunctivae are normal. Pupils are equal, round, and reactive to light. Right eye exhibits no discharge. Left eye exhibits no discharge. No scleral icterus.  Neck: Neck supple.  Cardiovascular: Normal rate, regular rhythm and normal heart sounds.   Pulmonary/Chest: Effort normal and breath sounds normal. No respiratory distress. She has no  wheezes. She has no rales.  Abdominal: Soft. There is no tenderness.  Musculoskeletal: She exhibits no tenderness.  Neurological: She is alert.  Cranial Nerves II-XII grossly intact. Baseline dementia.  Skin: Skin is warm and dry. No rash noted.  Psychiatric: She has a normal mood and affect.  Nursing note and vitals reviewed.   ED Course  Procedures (including critical care time) Labs Review Labs Reviewed - No data to display  Imaging Review Ct Head Wo Contrast  04/07/2015  CLINICAL DATA:  Fall with left orbital laceration. EXAM: CT HEAD WITHOUT CONTRAST TECHNIQUE: Contiguous axial images were obtained from  the base of the skull through the vertex without intravenous contrast. COMPARISON:  04/05/2015 FINDINGS: Stable advanced atrophy and small vessel disease. The brain demonstrates no evidence of hemorrhage, infarction, edema, mass effect, extra-axial fluid collection, hydrocephalus or mass lesion. No evidence of skull fracture. IMPRESSION: No acute findings.  Stable atrophy and small vessel disease. Electronically Signed   By: Aletta Edouard M.D.   On: 04/07/2015 21:06   I have personally reviewed and evaluated these images and lab results as part of my medical decision-making.   EKG Interpretation None     Filed Vitals:   04/07/15 1937  BP: 141/71  Pulse: 97  Temp: 98.4 F (36.9 C)  TempSrc: Oral  Resp: 20  SpO2: 93%     MDM  Lexys D Skibinski is a 82 y.o. female with a history of dementia comes in via EMS from [place] for evaluation of unwitnessed fall. Plan to obtain CT head. Patient remains a baseline moves all extremities without ataxia. She is not complaining of any other injuries. Per EMS and nursing facility, patient is at baseline. CT is negative for any acute intracranial abnormalities. Abrasion on the left eyebrow was cleaned, topical antibiotic and dressing applied. Patient is appropriate for discharge back to nursing home. Prior to discharge, I discussed and reviewed this case with my attending, Dr. Ralene Bathe. Final diagnoses:  Fall, initial encounter        Comer Locket, PA-C 04/07/15 2142  Quintella Reichert, MD 04/08/15 1125

## 2015-08-13 ENCOUNTER — Encounter: Payer: Self-pay | Admitting: Podiatry

## 2015-08-13 ENCOUNTER — Ambulatory Visit (INDEPENDENT_AMBULATORY_CARE_PROVIDER_SITE_OTHER): Payer: Medicare Other | Admitting: Podiatry

## 2015-08-13 VITALS — BP 144/74 | HR 71

## 2015-08-13 DIAGNOSIS — L97511 Non-pressure chronic ulcer of other part of right foot limited to breakdown of skin: Secondary | ICD-10-CM | POA: Diagnosis not present

## 2015-08-13 DIAGNOSIS — B351 Tinea unguium: Secondary | ICD-10-CM

## 2015-08-13 DIAGNOSIS — M79604 Pain in right leg: Secondary | ICD-10-CM

## 2015-08-13 DIAGNOSIS — M79671 Pain in right foot: Secondary | ICD-10-CM

## 2015-08-13 NOTE — Patient Instructions (Signed)
Seen for painful lesion 2nd toe right. All nails and the lesion debrided and padded. Return in 6 weeks.

## 2015-08-13 NOTE — Progress Notes (Signed)
SUBJECTIVE: 82 y.o. year old female presents accompanied by her care taker with pain in 2nd toe right. Patient was incoherent.   REVIEW OF SYSTEMS: A comprehensive review of systems was negative.  OBJECTIVE: DERMATOLOGIC EXAMINATION: Nails: Thick dystrophic nails on both great toes. Pre ulcerative digital corn 2nd digit at PIPJ medial aspect with intradermal bleeding and pain with ambulation.s:   VASCULAR EXAMINATION OF LOWER LIMBS: Pedal pulses faintly palpable. No edema or erythema noted.  NEUROLOGIC EXAMINATION OF THE LOWER LIMBS: All epicritic and tactile sensations grossly intact.   MUSCULOSKELETAL EXAMINATION: Valgus rotated 1st and 2nd digit right with intra digital lesion at IPJ medial aspect, pre ulcerative.   ASSESSMENT: Ulcerating inter digital corn 2nd right with pain. Valgus deformity 1st and 2nd digit right with skin lesion. Onychomycosis bilateral. Pain in lower limbs.  PLAN: Pre ulcerative lesion debrided and padded. All nails debrided. Return in 6 weeks.

## 2015-09-17 ENCOUNTER — Encounter (HOSPITAL_BASED_OUTPATIENT_CLINIC_OR_DEPARTMENT_OTHER): Payer: Self-pay | Admitting: Emergency Medicine

## 2015-09-17 ENCOUNTER — Encounter (HOSPITAL_BASED_OUTPATIENT_CLINIC_OR_DEPARTMENT_OTHER): Payer: Self-pay | Admitting: *Deleted

## 2015-09-17 ENCOUNTER — Emergency Department (HOSPITAL_BASED_OUTPATIENT_CLINIC_OR_DEPARTMENT_OTHER): Payer: Medicare Other

## 2015-09-17 ENCOUNTER — Inpatient Hospital Stay (HOSPITAL_BASED_OUTPATIENT_CLINIC_OR_DEPARTMENT_OTHER)
Admission: EM | Admit: 2015-09-17 | Discharge: 2015-09-20 | DRG: 481 | Disposition: A | Discharge status: Skilled Nursing Facility | Payer: Medicare Other | Attending: Internal Medicine | Admitting: Internal Medicine

## 2015-09-17 ENCOUNTER — Emergency Department (HOSPITAL_BASED_OUTPATIENT_CLINIC_OR_DEPARTMENT_OTHER)
Admission: EM | Admit: 2015-09-17 | Discharge: 2015-09-17 | Disposition: A | Discharge status: Home / Self Care | Payer: Medicare Other | Source: Home / Self Care | Attending: Emergency Medicine | Admitting: Emergency Medicine

## 2015-09-17 DIAGNOSIS — Z8673 Personal history of transient ischemic attack (TIA), and cerebral infarction without residual deficits: Secondary | ICD-10-CM

## 2015-09-17 DIAGNOSIS — Y92128 Other place in nursing home as the place of occurrence of the external cause: Secondary | ICD-10-CM | POA: Insufficient documentation

## 2015-09-17 DIAGNOSIS — S72141A Displaced intertrochanteric fracture of right femur, initial encounter for closed fracture: Secondary | ICD-10-CM | POA: Diagnosis present

## 2015-09-17 DIAGNOSIS — Z7982 Long term (current) use of aspirin: Secondary | ICD-10-CM | POA: Diagnosis not present

## 2015-09-17 DIAGNOSIS — Y939 Activity, unspecified: Secondary | ICD-10-CM | POA: Insufficient documentation

## 2015-09-17 DIAGNOSIS — S8011XA Contusion of right lower leg, initial encounter: Secondary | ICD-10-CM | POA: Insufficient documentation

## 2015-09-17 DIAGNOSIS — Z419 Encounter for procedure for purposes other than remedying health state, unspecified: Secondary | ICD-10-CM

## 2015-09-17 DIAGNOSIS — W1830XA Fall on same level, unspecified, initial encounter: Secondary | ICD-10-CM | POA: Diagnosis present

## 2015-09-17 DIAGNOSIS — Y92122 Bedroom in nursing home as the place of occurrence of the external cause: Secondary | ICD-10-CM | POA: Diagnosis not present

## 2015-09-17 DIAGNOSIS — S72009A Fracture of unspecified part of neck of unspecified femur, initial encounter for closed fracture: Secondary | ICD-10-CM | POA: Diagnosis present

## 2015-09-17 DIAGNOSIS — Z85820 Personal history of malignant melanoma of skin: Secondary | ICD-10-CM | POA: Diagnosis not present

## 2015-09-17 DIAGNOSIS — D62 Acute posthemorrhagic anemia: Secondary | ICD-10-CM | POA: Diagnosis not present

## 2015-09-17 DIAGNOSIS — Y999 Unspecified external cause status: Secondary | ICD-10-CM | POA: Insufficient documentation

## 2015-09-17 DIAGNOSIS — H9190 Unspecified hearing loss, unspecified ear: Secondary | ICD-10-CM | POA: Diagnosis present

## 2015-09-17 DIAGNOSIS — Z79899 Other long term (current) drug therapy: Secondary | ICD-10-CM | POA: Insufficient documentation

## 2015-09-17 DIAGNOSIS — Y92129 Unspecified place in nursing home as the place of occurrence of the external cause: Principal | ICD-10-CM

## 2015-09-17 DIAGNOSIS — F419 Anxiety disorder, unspecified: Secondary | ICD-10-CM | POA: Diagnosis present

## 2015-09-17 DIAGNOSIS — I639 Cerebral infarction, unspecified: Secondary | ICD-10-CM | POA: Diagnosis not present

## 2015-09-17 DIAGNOSIS — F0391 Unspecified dementia with behavioral disturbance: Secondary | ICD-10-CM | POA: Diagnosis present

## 2015-09-17 DIAGNOSIS — Z85828 Personal history of other malignant neoplasm of skin: Secondary | ICD-10-CM | POA: Insufficient documentation

## 2015-09-17 DIAGNOSIS — W1839XA Other fall on same level, initial encounter: Secondary | ICD-10-CM

## 2015-09-17 DIAGNOSIS — Z66 Do not resuscitate: Secondary | ICD-10-CM | POA: Diagnosis present

## 2015-09-17 DIAGNOSIS — W19XXXA Unspecified fall, initial encounter: Secondary | ICD-10-CM

## 2015-09-17 DIAGNOSIS — F03918 Unspecified dementia, unspecified severity, with other behavioral disturbance: Secondary | ICD-10-CM | POA: Diagnosis present

## 2015-09-17 LAB — CBC WITH DIFFERENTIAL/PLATELET
BASOS ABS: 0 10*3/uL (ref 0.0–0.1)
Basophils Relative: 0 %
EOS PCT: 0 %
Eosinophils Absolute: 0 10*3/uL (ref 0.0–0.7)
HCT: 44.4 % (ref 36.0–46.0)
Hemoglobin: 14.7 g/dL (ref 12.0–15.0)
LYMPHS PCT: 8 %
Lymphs Abs: 0.9 10*3/uL (ref 0.7–4.0)
MCH: 33.4 pg (ref 26.0–34.0)
MCHC: 33.1 g/dL (ref 30.0–36.0)
MCV: 100.9 fL — AB (ref 78.0–100.0)
MONO ABS: 0.7 10*3/uL (ref 0.1–1.0)
MONOS PCT: 6 %
Neutro Abs: 10.4 10*3/uL — ABNORMAL HIGH (ref 1.7–7.7)
Neutrophils Relative %: 86 %
PLATELETS: 149 10*3/uL — AB (ref 150–400)
RBC: 4.4 MIL/uL (ref 3.87–5.11)
RDW: 13.3 % (ref 11.5–15.5)
WBC: 12 10*3/uL — ABNORMAL HIGH (ref 4.0–10.5)

## 2015-09-17 LAB — BASIC METABOLIC PANEL
ANION GAP: 10 (ref 5–15)
BUN: 12 mg/dL (ref 6–20)
CALCIUM: 9 mg/dL (ref 8.9–10.3)
CHLORIDE: 99 mmol/L — AB (ref 101–111)
CO2: 30 mmol/L (ref 22–32)
Creatinine, Ser: 0.64 mg/dL (ref 0.44–1.00)
GFR calc Af Amer: >60 mL/min (ref >60)
GFR calc non Af Amer: >60 mL/min (ref >60)
GLUCOSE: 140 mg/dL — AB (ref 65–99)
POTASSIUM: 4.2 mmol/L (ref 3.5–5.1)
Sodium: 139 mmol/L (ref 135–145)

## 2015-09-17 LAB — URINALYSIS, ROUTINE W REFLEX MICROSCOPIC
Bilirubin Urine: NEGATIVE
Glucose, UA: NEGATIVE mg/dL
Hgb urine dipstick: NEGATIVE
Ketones, ur: 15 mg/dL — AB
Leukocytes, UA: NEGATIVE
Nitrite: NEGATIVE
Protein, ur: NEGATIVE mg/dL
Specific Gravity, Urine: 1.016 (ref 1.005–1.030)
pH: 7.5 (ref 5.0–8.0)

## 2015-09-17 LAB — TROPONIN I: Troponin I: <0.03 ng/mL (ref <0.031)

## 2015-09-17 LAB — MRSA PCR SCREENING: MRSA by PCR: NEGATIVE

## 2015-09-17 MED ORDER — ACETAMINOPHEN 500 MG PO TABS: 1000.0000 mg | ORAL_TABLET | Freq: Four times a day (QID) | ORAL | Status: DC | PRN

## 2015-09-17 MED ORDER — ASPIRIN EC 81 MG PO TBEC
81.0000 mg | DELAYED_RELEASE_TABLET | Freq: Every day | ORAL | Status: DC
  Administered 2015-09-19 – 2015-09-20 (×2): 81 mg via ORAL
  Filled 2015-09-17 (×3): qty 1

## 2015-09-17 MED ORDER — DEXTROSE-NACL 5-0.45 % IV SOLN
INTRAVENOUS | Status: DC
  Administered 2015-09-17: 19:00:00 via INTRAVENOUS

## 2015-09-17 MED ORDER — ENOXAPARIN SODIUM 30 MG/0.3ML ~~LOC~~ SOLN: 30.0000 mg | SUBCUTANEOUS | Status: DC

## 2015-09-17 MED ORDER — CEFAZOLIN SODIUM-DEXTROSE 2-4 GM/100ML-% IV SOLN
2.0000 g | INTRAVENOUS | Status: AC
  Administered 2015-09-18: 2 g via INTRAVENOUS
  Filled 2015-09-17 (×2): qty 100

## 2015-09-17 MED ORDER — DIVALPROEX SODIUM 125 MG PO CSDR
125.0000 mg | DELAYED_RELEASE_CAPSULE | Freq: Two times a day (BID) | ORAL | Status: DC
  Administered 2015-09-17 – 2015-09-20 (×5): 125 mg via ORAL
  Filled 2015-09-17 (×6): qty 1

## 2015-09-17 MED ORDER — SERTRALINE HCL 50 MG PO TABS
75.0000 mg | ORAL_TABLET | Freq: Every day | ORAL | Status: DC
  Administered 2015-09-19 – 2015-09-20 (×2): 75 mg via ORAL
  Filled 2015-09-17 (×2): qty 1

## 2015-09-17 MED ORDER — DOCUSATE SODIUM 100 MG PO CAPS
100.0000 mg | ORAL_CAPSULE | Freq: Two times a day (BID) | ORAL | Status: DC
  Administered 2015-09-18 – 2015-09-19 (×3): 100 mg via ORAL
  Filled 2015-09-17 (×4): qty 1

## 2015-09-17 MED ORDER — HYDROCODONE-ACETAMINOPHEN 5-325 MG PO TABS: 1.0000 | ORAL_TABLET | Freq: Four times a day (QID) | ORAL | Status: DC | PRN

## 2015-09-17 MED ORDER — ACETAMINOPHEN 325 MG PO TABS
650.0000 mg | ORAL_TABLET | Freq: Once | ORAL | Status: AC
  Administered 2015-09-17: 650 mg via ORAL
  Filled 2015-09-17: qty 2

## 2015-09-17 MED ORDER — POLYETHYLENE GLYCOL 3350 17 G PO PACK: 17.0000 g | PACK | Freq: Every day | ORAL | Status: DC | PRN

## 2015-09-17 MED ORDER — MORPHINE SULFATE (PF) 4 MG/ML IV SOLN
4.0000 mg | Freq: Once | INTRAVENOUS | Status: DC
  Filled 2015-09-17: qty 1

## 2015-09-17 MED ORDER — DONEPEZIL HCL 10 MG PO TABS
10.0000 mg | ORAL_TABLET | Freq: Every day | ORAL | Status: DC
  Administered 2015-09-19 – 2015-09-20 (×2): 10 mg via ORAL
  Filled 2015-09-17 (×2): qty 1

## 2015-09-17 MED ORDER — SODIUM CHLORIDE 0.9 % IV SOLN: INTRAVENOUS | Status: DC

## 2015-09-17 MED ORDER — LORAZEPAM 2 MG/ML IJ SOLN
2.0000 mg | Freq: Once | INTRAMUSCULAR | Status: AC
  Administered 2015-09-17: 2 mg via INTRAMUSCULAR
  Filled 2015-09-17: qty 1

## 2015-09-17 MED ORDER — ACETAMINOPHEN 325 MG PO TABS
650.0000 mg | ORAL_TABLET | Freq: Four times a day (QID) | ORAL | Status: DC | PRN
  Administered 2015-09-17 – 2015-09-18 (×2): 650 mg via ORAL
  Filled 2015-09-17 (×2): qty 2

## 2015-09-17 MED ORDER — POVIDONE-IODINE 10 % EX SWAB: 2.0000 "application " | Freq: Once | CUTANEOUS | Status: DC

## 2015-09-17 MED ORDER — CLONAZEPAM 0.5 MG PO TABS
0.2500 mg | ORAL_TABLET | Freq: Two times a day (BID) | ORAL | Status: DC | PRN
  Administered 2015-09-17: 0.25 mg via ORAL
  Filled 2015-09-17: qty 1

## 2015-09-17 MED ORDER — MORPHINE SULFATE (PF) 2 MG/ML IV SOLN
0.5000 mg | INTRAVENOUS | Status: DC | PRN
Start: 2015-09-17 — End: 2015-09-18

## 2015-09-17 MED ORDER — CHLORHEXIDINE GLUCONATE 4 % EX LIQD
60.0000 mL | Freq: Once | CUTANEOUS | Status: AC
  Administered 2015-09-18: 4 via TOPICAL
  Filled 2015-09-17: qty 60

## 2015-09-17 NOTE — ED Notes (Signed)
Attempted tct mr. Christina Summers and mrs. Christina Summers at [phone] multiple times with no answer, will attempt to try again prior to transfer to [facility].

## 2015-09-17 NOTE — ED Notes (Signed)
D/c paperwork, prescriptions, and 1 bag pt clothing containing shoes, socks, and pants given to PTAR at discharge

## 2015-09-17 NOTE — ED Notes (Signed)
tct to [facility] [phone] Christina Summers, pt is DNR,

## 2015-09-17 NOTE — H&P (Addendum)
TRH H&P   Patient Demographics:    Christina Summers, is a 82 y.o. female  MRN: [id number]   DOB - 03-02-1925  Admit Date - 09/17/2015  Outpatient Primary MD for the patient is  Melinda Crutch, MD  Referring MD/NP/PA: [facility]  Patient coming from: SNF  Chief Complaint  Patient presents with  . Fall      HPI:    Christina Summers  is a 82 y.o. female, A shunt-year-old female with past medical history of stroke, dementia, who lives at skilled nursing facility,  patient  with advanced dementia, was combative in ED, where she received 2 mg of IV Ativan, currently she is lethargic, can't provide any complaints, try to contact family members on records, with no answer, so history was obtained mainly from medical records and previous ED physician documentation. Patient presents with fall, it was earlier at [facility] with a fall, was able to ambulate, discharged back to facility, unfortunately she did sustained another unwitnessed fall, and her CT hip was significant for right intertrochanteric hip fracture, workup in ED significant for mild leukocytosis at 12, she is afebrile, negative troponin, EKG nonacute.   Review of systems:    In addition to the HPI above,  Patient cannot provide any review of system giving her dementia and mental status.   With Past History of the following :    Past Medical History  Diagnosis Date  . Insomnia, unspecified   . Hard of hearing 06/25/2012  . Stroke ([place])     tia  . Cancer ([place])     skin  . Dementia   . Dementia with behavioral disturbance 06/25/2012  . Anxiety       Past Surgical History  Procedure Laterality Date  . No past surgeries    . Excision melanoma with sentinel lymph node biopsy Left 10/07/2013    Procedure:  LEFT UPPER EXTREMITY EXCISIONAL BIOPSY;  Surgeon: Ralene Ok, MD;  Location: [facility];  Service: General;  Laterality: Left;      Social History:     Social History  Substance Use Topics  . Smoking status: Never Smoker   . Smokeless tobacco: Never Used  . Alcohol Use: No     Lives - at SNF  Mobility - unknown     Family History :     Family History  Problem Relation Age of Onset  . Other Mother   . Other Father   . Heart attack Father      Home Medications:   Prior to Admission medications   Medication Sig Start Date End Date Taking? Authorizing Provider  acetaminophen (TYLENOL) 500 MG tablet Take 2 tablets (1,000 mg total) by mouth every 6 (six) hours as needed for moderate pain. 09/17/15  Yes Blanchie Dessert, MD  aspirin 81 MG tablet Take 81 mg by mouth daily.   Yes Historical Provider, MD  Cholecalciferol (VITAMIN D3)  1000 units CAPS Take by mouth daily.   Yes Historical Provider, MD  clonazePAM (KLONOPIN) 0.5 MG tablet Take 0.5 mg by mouth 2 (two) times daily as needed for anxiety.   Yes Historical Provider, MD  divalproex (DEPAKOTE SPRINKLE) 125 MG capsule Take 125 mg by mouth 2 (two) times daily.   Yes Historical Provider, MD  donepezil (ARICEPT) 10 MG tablet Take 1 tablet (10 mg total) by mouth daily. 07/29/13  Yes Dennie Bible, NP  neomycin-bacitracin-polymyxin (NEOSPORIN) [phone] ointment Apply 1 application topically daily as needed.   Yes Historical Provider, MD  sertraline (ZOLOFT) 50 MG tablet Take 75 mg by mouth daily.   Yes Historical Provider, MD     Allergies:    No Known Allergies   Physical Exam:   Vitals  Blood pressure 145/85, pulse 91, temperature 97.6 F (36.4 C), temperature source Axillary, resp. rate 18, weight 49.896 kg (110 lb), SpO2 95 %.   1. General ,Elderly female lying in bed in NAD,    2. And is sleepy, but arouses to loud verbal stimuli, does not follow command  3. Unable to perform neurological exam as she does not follow command, but appears to be moving extremities without gross deficits.  4. Ears and Eyes appear Normal, Conjunctivae clear,  PERRLA. dry Oral Mucosa.  5. Supple Neck, No JVD, No Carotid Bruits.  6. Symmetrical Chest wall movement, Good air movement bilaterally, CTAB.  7. RRR, No Gallops, Rubs or Murmurs,   8. Positive Bowel Sounds, Abdomen Soft, No tenderness,No rebound -guarding or rigidity.  9.  No Cyanosis, Normal Skin Turgor, No Skin Rash or Bruise.  10. Good muscle tone,  joints appear normal   11. No Palpable Lymph Nodes in Neck or Axillae     Data Review:    CBC  Recent Labs Lab 09/17/15 1404  WBC 12.0*  HGB 14.7  HCT 44.4  PLT 149*  MCV 100.9*  MCH 33.4  MCHC 33.1  RDW 13.3  LYMPHSABS 0.9  MONOABS 0.7  EOSABS 0.0  BASOSABS 0.0   ------------------------------------------------------------------------------------------------------------------  Chemistries   Recent Labs Lab 09/17/15 1404  NA 139  K 4.2  CL 99*  CO2 30  GLUCOSE 140*  BUN 12  CREATININE 0.64  CALCIUM 9.0   ------------------------------------------------------------------------------------------------------------------ estimated creatinine clearance is 37.6 mL/min (by C-G formula based on Cr of 0.64). ------------------------------------------------------------------------------------------------------------------ No results for input(s): TSH, T4TOTAL, T3FREE, THYROIDAB in the last 72 hours.  Invalid input(s): FREET3  Coagulation profile No results for input(s): INR, PROTIME in the last 168 hours. ------------------------------------------------------------------------------------------------------------------- No results for input(s): DDIMER in the last 72 hours. -------------------------------------------------------------------------------------------------------------------  Cardiac Enzymes  Recent Labs Lab 09/17/15 1404  TROPONINI <0.03   ------------------------------------------------------------------------------------------------------------------ No results found for:  BNP   ---------------------------------------------------------------------------------------------------------------  Urinalysis    Component Value Date/Time   COLORURINE YELLOW 01/17/2014 1950   APPEARANCEUR CLOUDY* 01/17/2014 1950   LABSPEC 1.007 01/17/2014 1950   PHURINE 7.0 01/17/2014 1950   GLUCOSEU NEGATIVE 01/17/2014 1950   HGBUR NEGATIVE 01/17/2014 1950   BILIRUBINUR NEGATIVE 01/17/2014 1950   KETONESUR NEGATIVE 01/17/2014 1950   PROTEINUR NEGATIVE 01/17/2014 1950   UROBILINOGEN 1.0 01/17/2014 1950   NITRITE NEGATIVE 01/17/2014 1950   LEUKOCYTESUR TRACE* 01/17/2014 1950    ----------------------------------------------------------------------------------------------------------------   Imaging Results:    Ct Hip Right Wo Contrast  09/17/2015  CLINICAL DATA:  Fall at nursing home, right hip pain. EXAM: CT OF THE RIGHT HIP WITHOUT CONTRAST TECHNIQUE: Multidetector CT imaging of the right hip was performed  according to the standard protocol. Multiplanar CT image reconstructions were also generated. COMPARISON:  Radiographs from 09/17/2015 FINDINGS: Nondisplaced intertrochanteric fracture. There is a mildly displaced greater trochanteric fragment but the extension over to the lesser trochanter is subtle. Visualized right hemipelvis unremarkable. Mild spurring of the acetabulum and femoral head. Vascular calcifications noted. Direct inguinal hernia contains adipose tissue. Abnormal subcutaneous bruising adjacent to the greater trochanter. IMPRESSION: 1. Nondisplaced intertrochanteric fracture, also including a mildly displaced transverse greater trochanteric fragment. 2. Direct right inguinal hernia contains adipose tissue. 3. Abnormal subcutaneous hematoma/bruising adjacent to the greater trochanter. Electronically Signed   By: Van Clines M.D.   On: 09/17/2015 13:39   Dg Chest Port 1 View  09/17/2015  CLINICAL DATA:  Jerelyn Scott twice today. EXAM: PORTABLE CHEST 1 VIEW  COMPARISON:  04/05/2015 and 01/17/2014 FINDINGS: Lungs are adequately inflated without focal consolidation or effusion. Cardiomediastinal silhouette is within normal. There is calcified plaque over the thoracic aorta. Old bilateral rib fractures and possible acute to subacute posterior lateral right ninth rib fracture. Degenerative change of the spine. IMPRESSION: No acute cardiopulmonary disease. Old bilateral rib fractures. Acute to subacute right posterior lateral ninth rib fracture. Aortic atherosclerosis. Electronically Signed   By: Marin Olp M.D.   On: 09/17/2015 14:15   Dg Knee Complete 4 Views Right  09/17/2015  CLINICAL DATA:  Status post fall this morning with a right knee injury. Pain. Initial encounter. EXAM: RIGHT KNEE - COMPLETE 4+ VIEW COMPARISON:  None. FINDINGS: No evidence of fracture, dislocation, or joint effusion. No evidence of arthropathy or other focal bone abnormality. Atherosclerotic vascular disease noted. IMPRESSION: No acute abnormality. Atherosclerosis. Electronically Signed   By: Inge Rise M.D.   On: 09/17/2015 10:05   Dg Hip Unilat With Pelvis 2-3 Views Right  09/17/2015  CLINICAL DATA:  Status post fall this morning.  Right hip pain. EXAM: DG HIP (WITH OR WITHOUT PELVIS) 2-3V RIGHT COMPARISON:  None. FINDINGS: There is no evidence of hip fracture or dislocation. Mild bilateral hip degenerative disease is seen. Atherosclerosis is noted. IMPRESSION: No acute abnormality. Mild bilateral hip degenerative change. Electronically Signed   By: Inge Rise M.D.   On: 09/17/2015 08:39    My personal review of EKG: Rhythm NSR, Rate  73 /min, QTc460 , old ST changes in inferior lateral leads   Assessment & Plan:    Active Problems:   Dementia with behavioral disturbance   CVA (cerebral infarction)   Fracture, intertrochanteric, right femur (HCC)  Right intertrochanteric femur fracture - Patient presents with unwitnessed fall, did undergo orthopedic pathway,  continue with when necessary pain, nausea medication, EKG nonacute, first troponin is negative,ortho  consulted, and this will need surgical repair, patient is moderate risk for surgical intervention, no further workup indicated prior to surgery, her to call family to obtain, but no answer. - Patient to be having history of multiple falls, as her chest x-ray showing old rib fracture and Acute to subacute right posterior lateral ninth rib fracture, will advice  use incentive spirometry if she is able.  History of CVA - Continue with aspirin  dementia with behavioral disturbances - Patient received Ativan in ED secondary to combativeness, continue with home dose when necessary clonazepam, would have bedside sitter   DVT Prophylaxis , SCDs, andlovenox tomorrow after surgery  AM Labs Ordered, also please review Full Orders  Family Communication: Admission, patients condition and plan of care including tests being ordered, called all available phone numbers, left message for family to call  back.  Code Status DO NOT RESUSCITATE, as a follow-up from facility  Likely DC to  back to SNF  Condition GUARDED    Consults called: ortho Dr Erlinda Hong   Admission status: inpatient  Time spent in minutes : 36 mminutes   Deliah Strehlow M.D on 09/17/2015 at 6:07 PM  Between 7am to 7pm - Pager - [phone]. After 7pm go to [email or web address] - password [facility]  [facility] - Office  [phone]

## 2015-09-17 NOTE — ED Notes (Signed)
Patient transported to X-ray 

## 2015-09-17 NOTE — ED Provider Notes (Addendum)
CSN: [id number]     Arrival date & time 09/17/15  1248 History   First MD Initiated Contact with Patient 09/17/15 1304     Chief Complaint  Patient presents with  . Fall     (Consider location/radiation/quality/duration/timing/severity/associated sxs/prior Treatment) HPI Comments: Patient is an 82 year old female with a history of stroke, dementia who lives in a nursing facility and was here just several hours ago and discharged after a witnessed fall who presents after recurrent fall. This fall was unwitnessed and staff came in and found patient lying on the floor. She continues to complain of pain in her right side. Patient did ambulate prior to discharge however now has significant pain with range of motion of her right hip. No other signs of injury. Patient's mental status has been unchanged. Patient is unable to answer any questions. Pain is reproducible with movement of the right hip  Patient is a 82 y.o. female presenting with fall. The history is provided by the nursing home and the EMS personnel. The history is limited by the absence of a caregiver.  Fall This is a recurrent problem. The current episode started less than 1 hour ago. The problem occurs constantly.    Past Medical History  Diagnosis Date  . Insomnia, unspecified   . Hard of hearing 06/25/2012  . Stroke ([place])     tia  . Cancer ([place])     skin  . Dementia   . Dementia with behavioral disturbance 06/25/2012  . Anxiety    Past Surgical History  Procedure Laterality Date  . No past surgeries    . Excision melanoma with sentinel lymph node biopsy Left 10/07/2013    Procedure:  LEFT UPPER EXTREMITY EXCISIONAL BIOPSY;  Surgeon: Ralene Ok, MD;  Location: [facility];  Service: General;  Laterality: Left;   Family History  Problem Relation Age of Onset  . Other Mother   . Other Father   . Heart attack Father    Social History  Substance Use Topics  . Smoking status: Never Smoker   . Smokeless tobacco: Never Used    . Alcohol Use: No   OB History    No data available     Review of Systems  Unable to perform ROS: Dementia      Allergies  Review of patient's allergies indicates no known allergies.  Home Medications   Prior to Admission medications   Medication Sig Start Date End Date Taking? Authorizing Provider  acetaminophen (TYLENOL) 500 MG tablet Take 2 tablets (1,000 mg total) by mouth every 6 (six) hours as needed for moderate pain. 09/17/15   Blanchie Dessert, MD  aspirin 325 MG tablet Take 1 tablet (325 mg total) by mouth daily. 07/18/12   Ripudeep Krystal Eaton, MD  aspirin 81 MG tablet Take 81 mg by mouth daily.    Historical Provider, MD  Cholecalciferol (VITAMIN D3) 1000 units CAPS Take by mouth daily.    Historical Provider, MD  clonazePAM (KLONOPIN) 0.5 MG tablet Take 0.5 mg by mouth 2 (two) times daily as needed for anxiety.    Historical Provider, MD  divalproex (DEPAKOTE SPRINKLE) 125 MG capsule Take 125 mg by mouth 2 (two) times daily.    Historical Provider, MD  donepezil (ARICEPT) 10 MG tablet Take 1 tablet (10 mg total) by mouth daily. 07/29/13   Dennie Bible, NP  doxycycline (VIBRAMYCIN) 100 MG capsule Take 100 mg by mouth 2 (two) times daily. Daughter states she is taking for 10 days only started  medication on 09-26-13    Historical Provider, MD  oxyCODONE-acetaminophen (ROXICET) 5-325 MG per tablet Take 1-2 tablets by mouth every 4 (four) hours as needed. 10/07/13   Ralene Ok, MD  risperiDONE (RISPERDAL) 2 MG tablet 1-2 tabs qhs, one tab qam. In replace of seroquel 07/26/13   Marcial Pacas, MD  sertraline (ZOLOFT) 50 MG tablet Take 75 mg by mouth daily.    Historical Provider, MD   BP 169/79 mmHg  Pulse 60  Temp(Src) 98.6 F (37 C)  Resp 16  Wt 110 lb (49.896 kg)  SpO2 96% Physical Exam  Constitutional: She appears well-developed and well-nourished. No distress.  HENT:  Head: Normocephalic and atraumatic.  Mouth/Throat: Oropharynx is clear and moist.  Eyes:  Conjunctivae and EOM are normal. Pupils are equal, round, and reactive to light.  Neck: Normal range of motion. Neck supple.  Cardiovascular: Normal rate, regular rhythm and intact distal pulses.   No murmur heard. Pulmonary/Chest: Effort normal and breath sounds normal. No respiratory distress. She has no wheezes. She has no rales.  Abdominal: Soft. She exhibits no distension. There is no tenderness. There is no rebound and no guarding.  Musculoskeletal: She exhibits tenderness. She exhibits no edema.       Right hip: She exhibits decreased range of motion, tenderness and bony tenderness.       Legs: Neurological: She is alert.  Severe dementia  Skin: Skin is warm and dry. No rash noted. No erythema.  Psychiatric: She has a normal mood and affect. Her behavior is normal.  Nursing note and vitals reviewed.   ED Course  Procedures (including critical care time) Labs Review Labs Reviewed  CBC WITH DIFFERENTIAL/PLATELET - Abnormal; Notable for the following:    WBC 12.0 (*)    MCV 100.9 (*)    Platelets 149 (*)    Neutro Abs 10.4 (*)    All other components within normal limits  BASIC METABOLIC PANEL - Abnormal; Notable for the following:    Chloride 99 (*)    Glucose, Bld 140 (*)    All other components within normal limits  TROPONIN I    Imaging Review Ct Hip Right Wo Contrast  09/17/2015  CLINICAL DATA:  Fall at nursing home, right hip pain. EXAM: CT OF THE RIGHT HIP WITHOUT CONTRAST TECHNIQUE: Multidetector CT imaging of the right hip was performed according to the standard protocol. Multiplanar CT image reconstructions were also generated. COMPARISON:  Radiographs from 09/17/2015 FINDINGS: Nondisplaced intertrochanteric fracture. There is a mildly displaced greater trochanteric fragment but the extension over to the lesser trochanter is subtle. Visualized right hemipelvis unremarkable. Mild spurring of the acetabulum and femoral head. Vascular calcifications noted. Direct  inguinal hernia contains adipose tissue. Abnormal subcutaneous bruising adjacent to the greater trochanter. IMPRESSION: 1. Nondisplaced intertrochanteric fracture, also including a mildly displaced transverse greater trochanteric fragment. 2. Direct right inguinal hernia contains adipose tissue. 3. Abnormal subcutaneous hematoma/bruising adjacent to the greater trochanter. Electronically Signed   By: Van Clines M.D.   On: 09/17/2015 13:39   Dg Knee Complete 4 Views Right  09/17/2015  CLINICAL DATA:  Status post fall this morning with a right knee injury. Pain. Initial encounter. EXAM: RIGHT KNEE - COMPLETE 4+ VIEW COMPARISON:  None. FINDINGS: No evidence of fracture, dislocation, or joint effusion. No evidence of arthropathy or other focal bone abnormality. Atherosclerotic vascular disease noted. IMPRESSION: No acute abnormality. Atherosclerosis. Electronically Signed   By: Inge Rise M.D.   On: 09/17/2015 10:05   Dg  Hip Unilat With Pelvis 2-3 Views Right  09/17/2015  CLINICAL DATA:  Status post fall this morning.  Right hip pain. EXAM: DG HIP (WITH OR WITHOUT PELVIS) 2-3V RIGHT COMPARISON:  None. FINDINGS: There is no evidence of hip fracture or dislocation. Mild bilateral hip degenerative disease is seen. Atherosclerosis is noted. IMPRESSION: No acute abnormality. Mild bilateral hip degenerative change. Electronically Signed   By: Inge Rise M.D.   On: 09/17/2015 08:39   I have personally reviewed and evaluated these images and lab results as part of my medical decision-making.   EKG Interpretation   Date/Time:  Thursday September 17 2015 13:52:31 EDT Ventricular Rate:  73 PR Interval:  120 QRS Duration: 92 QT Interval:  418 QTC Calculation: 460 R Axis:   48 Text Interpretation:  Normal sinus rhythm Anterior infarct , age  undetermined ST \\T \ T wave abnormality, consider inferolateral ischemia No  significant change since last tracing Confirmed by [facility]  MD, Loree Fee   [phone]) on 09/17/2015 2:23:42 PM      MDM   Final diagnoses:  Intertrochanteric fracture of right femur, closed, initial encounter [facility])    Patient was just seen here several hours ago on return from nursing home because of recurrent fall. Patient is complaining again of her right leg pain. At this time no other signs of injury. Will do a CT of the leg to ensure no new injury.  CT today consistent with a nondisplaced intertrochanteric fracture with mildly displaced transverse greater trochanteric fragment. Medical screening labs pending. We'll discuss with orthopedics.  3:11 PM Screening medical labs without significant change. Screening chest x-ray shows subacute fracture but patient has no brim pain at this time. Will discuss with hospitalist for admission. Attempted to contact patient's power of attorney but no one is answering the phone at this time. Unable to leave a message.  3:43 PM Spoke with Dr. Erlinda Hong who will see the pt in consultation.  Also was able to get a hold of Moshe Salisbury patient's power of attorney and she is aware of the situation and the number is available on her paperwork. Blanchie Dessert, MD 09/17/15 1512  Blanchie Dessert, MD 09/17/15 [id number]  Blanchie Dessert, MD 09/17/15 1557

## 2015-09-17 NOTE — ED Notes (Signed)
[facility] phoned for pt transport back to [facility]. "I got you on the list."

## 2015-09-17 NOTE — Progress Notes (Signed)
82 year old female presenting after a fall with persistent right hip pain. Imaging consistent with right intertrochanteric fracture. EDP that has made multiple attempts to contact or so and patient's healthcare power of attorney. Patient likely to be surgical repair. Patient has been accepted to medical surgical bed here at [facility]. Surgery tentatively scheduled for 09/18/2015 but this will be dependent on orthopedic evaluation. Patient is DO NOT RESUSCITATE. AF VSS.   Linna Darner, MD [facility] 09/17/2015, 3:36 PM

## 2015-09-17 NOTE — ED Notes (Addendum)
Pt from [facility] ([place]). Pt stumbled and fell this morning, witnessed by staff. Pt was lying on left side upon EMS arrival. C/o of right hip pain initially. Hx of dementia. Skin tear noted left hand

## 2015-09-17 NOTE — ED Notes (Signed)
Pt observed standing at foot of bed. Side rails up x 2. Pt assisted back to bed x 4 staff.

## 2015-09-17 NOTE — Discharge Instructions (Signed)

## 2015-09-17 NOTE — ED Notes (Signed)
Per facility, pt was found lying in the floor of her room. Per facility they think she fell to ground while in her room, unwitnessed fall. Per ems, pt was lying on floor of room with pad placed perfectly under her, no obvious injuries pt has no complaints

## 2015-09-17 NOTE — ED Notes (Signed)
Attempt to call report to 5n, RN busy telephone number left for nurse to return telephone call for report. REport given to  [facility]

## 2015-09-17 NOTE — ED Provider Notes (Addendum)
CSN: [id number]     Arrival date & time 09/17/15  0746 History   First MD Initiated Contact with Patient 09/17/15 0815     Chief Complaint  Patient presents with  . Fall     (Consider location/radiation/quality/duration/timing/severity/associated sxs/prior Treatment) Patient is a 82 y.o. female presenting with fall. The history is provided by the EMS personnel and the nursing home. The history is limited by the absence of a caregiver.  Fall This is a new problem. The current episode started less than 1 hour ago. The problem occurs constantly. The problem has not changed since onset.Associated symptoms comments: Right hip pain. The symptoms are aggravated by bending. Nothing relieves the symptoms. She has tried nothing for the symptoms. The treatment provided no relief.    Past Medical History  Diagnosis Date  . Insomnia, unspecified   . Hard of hearing 06/25/2012  . Stroke ([place])     tia  . Cancer ([place])     skin  . Dementia   . Dementia with behavioral disturbance 06/25/2012  . Anxiety    Past Surgical History  Procedure Laterality Date  . No past surgeries    . Excision melanoma with sentinel lymph node biopsy Left 10/07/2013    Procedure:  LEFT UPPER EXTREMITY EXCISIONAL BIOPSY;  Surgeon: Ralene Ok, MD;  Location: [facility];  Service: General;  Laterality: Left;   Family History  Problem Relation Age of Onset  . Other Mother   . Other Father   . Heart attack Father    Social History  Substance Use Topics  . Smoking status: Never Smoker   . Smokeless tobacco: Never Used  . Alcohol Use: No   OB History    No data available     Review of Systems  Unable to perform ROS: Dementia      Allergies  Review of patient's allergies indicates no known allergies.  Home Medications   Prior to Admission medications   Medication Sig Start Date End Date Taking? Authorizing Provider  aspirin 81 MG tablet Take 81 mg by mouth daily.   Yes Historical Provider, MD    Cholecalciferol (VITAMIN D3) 1000 units CAPS Take by mouth daily.   Yes Historical Provider, MD  clonazePAM (KLONOPIN) 0.5 MG tablet Take 0.5 mg by mouth 2 (two) times daily as needed for anxiety.   Yes Historical Provider, MD  divalproex (DEPAKOTE SPRINKLE) 125 MG capsule Take 125 mg by mouth 2 (two) times daily.   Yes Historical Provider, MD  donepezil (ARICEPT) 10 MG tablet Take 1 tablet (10 mg total) by mouth daily. 07/29/13  Yes Dennie Bible, NP  sertraline (ZOLOFT) 50 MG tablet Take 75 mg by mouth daily.   Yes Historical Provider, MD  aspirin 325 MG tablet Take 1 tablet (325 mg total) by mouth daily. 07/18/12   Ripudeep Krystal Eaton, MD  doxycycline (VIBRAMYCIN) 100 MG capsule Take 100 mg by mouth 2 (two) times daily. Daughter states she is taking for 10 days only started medication on 09-26-13    Historical Provider, MD  oxyCODONE-acetaminophen (ROXICET) 5-325 MG per tablet Take 1-2 tablets by mouth every 4 (four) hours as needed. 10/07/13   Ralene Ok, MD  risperiDONE (RISPERDAL) 2 MG tablet 1-2 tabs qhs, one tab qam. In replace of seroquel 07/26/13   Marcial Pacas, MD   BP 160/80 mmHg  Pulse 64  Temp(Src) 97.6 F (36.4 C) (Oral)  Resp 18  Ht 5\' 5"  (1.651 m)  Wt 110 lb (49.896 kg)  BMI 18.31 kg/m2  SpO2 95% Physical Exam  Constitutional: She appears well-developed and well-nourished. No distress.  HENT:  Head: Normocephalic and atraumatic.  Mouth/Throat: Oropharynx is clear and moist.  Eyes: Conjunctivae and EOM are normal. Pupils are equal, round, and reactive to light.  Neck: Normal range of motion. Neck supple.  Cardiovascular: Normal rate, regular rhythm and intact distal pulses.   No murmur heard. Pulmonary/Chest: Effort normal and breath sounds normal. No respiratory distress. She has no wheezes. She has no rales.  Abdominal: Soft. She exhibits no distension. There is no tenderness. There is no rebound.  Musculoskeletal: Normal range of motion. She exhibits no edema.        Left wrist: Normal.       Right hip: She exhibits tenderness. She exhibits no deformity.       Left hip: Normal.       Cervical back: Normal.       Thoracic back: Normal.       Lumbar back: Normal.       Hands:      Legs: Neurological: She is alert.  Asking the same question repeatedly which is apparently her baseline.  Skin: Skin is warm and dry. No rash noted. No erythema.  Psychiatric:  Demented but cooperative  Nursing note and vitals reviewed.   ED Course  Procedures (including critical care time) Labs Review Labs Reviewed - No data to display  Imaging Review Dg Knee Complete 4 Views Right  09/17/2015  CLINICAL DATA:  Status post fall this morning with a right knee injury. Pain. Initial encounter. EXAM: RIGHT KNEE - COMPLETE 4+ VIEW COMPARISON:  None. FINDINGS: No evidence of fracture, dislocation, or joint effusion. No evidence of arthropathy or other focal bone abnormality. Atherosclerotic vascular disease noted. IMPRESSION: No acute abnormality. Atherosclerosis. Electronically Signed   By: Inge Rise M.D.   On: 09/17/2015 10:05   Dg Hip Unilat With Pelvis 2-3 Views Right  09/17/2015  CLINICAL DATA:  Status post fall this morning.  Right hip pain. EXAM: DG HIP (WITH OR WITHOUT PELVIS) 2-3V RIGHT COMPARISON:  None. FINDINGS: There is no evidence of hip fracture or dislocation. Mild bilateral hip degenerative disease is seen. Atherosclerosis is noted. IMPRESSION: No acute abnormality. Mild bilateral hip degenerative change. Electronically Signed   By: Inge Rise M.D.   On: 09/17/2015 08:39   I have personally reviewed and evaluated these images and lab results as part of my medical decision-making.   EKG Interpretation None      MDM   Final diagnoses:  Fall at nursing home, initial encounter  Contusion of right leg, initial encounter    Patient is an 82 year old female presenting today from her nursing facility after a witnessed fall. She turned and lost her  balance falling onto the floor. When EMS arrived she was lying on the left side however she complains of right hip pain. She has severe dementia but has no other complaints of pain. On exam unable to elicit any pain with palpation of her back, chest or abdomen. Full range of motion of the left leg without reproduction of pain. When ranging the right hip she complains of some pain and grabs her femur. The knee appears to be nontender with full range of motion on the right. She is neurovascularly intact at this time. Small skin tear to the left palm. No other obvious signs of injury. No documented LOC patient has no evidence of dense of injury to her head. No anticoagulation other  than aspirin at this time.  Right hip and knee imaging pending and patient given Tylenol.  8:52 AM Imaging is neg will attempt to ambulate the pt. 10:10 AM Patient was able to ambulate and was discharged back home.    Blanchie Dessert, MD 09/17/15 [place], MD 09/17/15 1012

## 2015-09-17 NOTE — ED Notes (Signed)
Per ems, pt has no complaints, med tech called for patient to be transferred out, no obvious injuries, tct facility and was told that med tech shequita laurey, called to have patient transferred out. Pt arrived in same clothes and depends that she was originally sent out from our ER several hours ago.Christina Summers is DON at facility. Pt has no noted injuries, denies pain.

## 2015-09-18 ENCOUNTER — Inpatient Hospital Stay (HOSPITAL_COMMUNITY): Payer: Medicare Other | Admitting: Certified Registered"

## 2015-09-18 ENCOUNTER — Encounter (HOSPITAL_COMMUNITY)
Admission: EM | Disposition: A | Discharge status: Skilled Nursing Facility | Payer: Self-pay | Source: Home / Self Care | Attending: Internal Medicine

## 2015-09-18 ENCOUNTER — Inpatient Hospital Stay (HOSPITAL_COMMUNITY): Payer: Medicare Other

## 2015-09-18 DIAGNOSIS — I639 Cerebral infarction, unspecified: Secondary | ICD-10-CM

## 2015-09-18 HISTORY — PX: INTRAMEDULLARY (IM) NAIL INTERTROCHANTERIC: SHX5875

## 2015-09-18 LAB — CBC
HCT: 41.1 % (ref 36.0–46.0)
Hemoglobin: 13.4 g/dL (ref 12.0–15.0)
MCH: 32.5 pg (ref 26.0–34.0)
MCHC: 32.6 g/dL (ref 30.0–36.0)
MCV: 99.8 fL (ref 78.0–100.0)
PLATELETS: 140 10*3/uL — AB (ref 150–400)
RBC: 4.12 MIL/uL (ref 3.87–5.11)
RDW: 13.3 % (ref 11.5–15.5)
WBC: 9.1 10*3/uL (ref 4.0–10.5)

## 2015-09-18 LAB — BASIC METABOLIC PANEL
Anion gap: 8 (ref 5–15)
BUN: 8 mg/dL (ref 6–20)
CALCIUM: 8.9 mg/dL (ref 8.9–10.3)
CO2: 30 mmol/L (ref 22–32)
CREATININE: 0.66 mg/dL (ref 0.44–1.00)
Chloride: 101 mmol/L (ref 101–111)
Glucose, Bld: 155 mg/dL — ABNORMAL HIGH (ref 65–99)
Potassium: 3.7 mmol/L (ref 3.5–5.1)
Sodium: 139 mmol/L (ref 135–145)

## 2015-09-18 SURGERY — FIXATION, FRACTURE, INTERTROCHANTERIC, WITH INTRAMEDULLARY ROD
Anesthesia: General | Site: Hip | Laterality: Right

## 2015-09-18 MED ORDER — MENTHOL 3 MG MT LOZG: 1.0000 | LOZENGE | OROMUCOSAL | Status: DC | PRN

## 2015-09-18 MED ORDER — CEFAZOLIN SODIUM-DEXTROSE 2-4 GM/100ML-% IV SOLN
2.0000 g | Freq: Four times a day (QID) | INTRAVENOUS | Status: AC
  Administered 2015-09-18 – 2015-09-19 (×3): 2 g via INTRAVENOUS
  Filled 2015-09-18 (×3): qty 100

## 2015-09-18 MED ORDER — HYDROCODONE-ACETAMINOPHEN 5-325 MG PO TABS
1.0000 | ORAL_TABLET | Freq: Four times a day (QID) | ORAL | Status: DC | PRN
  Administered 2015-09-19: 1 via ORAL
  Filled 2015-09-18: qty 1

## 2015-09-18 MED ORDER — MORPHINE SULFATE (PF) 2 MG/ML IV SOLN: 0.5000 mg | INTRAVENOUS | Status: DC | PRN

## 2015-09-18 MED ORDER — HYDROCODONE-ACETAMINOPHEN 7.5-325 MG PO TABS: 1.0000 | ORAL_TABLET | Freq: Four times a day (QID) | ORAL | Status: AC | PRN

## 2015-09-18 MED ORDER — PHENOL 1.4 % MT LIQD: 1.0000 | OROMUCOSAL | Status: DC | PRN

## 2015-09-18 MED ORDER — SODIUM CHLORIDE 0.9 % IV SOLN
INTRAVENOUS | Status: DC
  Administered 2015-09-18 (×2): via INTRAVENOUS

## 2015-09-18 MED ORDER — EPHEDRINE SULFATE 50 MG/ML IJ SOLN
INTRAMUSCULAR | Status: DC | PRN
  Administered 2015-09-18: 10 mg via INTRAVENOUS

## 2015-09-18 MED ORDER — METHOCARBAMOL 500 MG PO TABS
500.0000 mg | ORAL_TABLET | Freq: Four times a day (QID) | ORAL | Status: DC | PRN
  Administered 2015-09-19: 500 mg via ORAL
  Filled 2015-09-18: qty 1

## 2015-09-18 MED ORDER — ROCURONIUM BROMIDE 50 MG/5ML IV SOLN
INTRAVENOUS | Status: AC
  Filled 2015-09-18: qty 1

## 2015-09-18 MED ORDER — METHOCARBAMOL 1000 MG/10ML IJ SOLN
500.0000 mg | Freq: Four times a day (QID) | INTRAVENOUS | Status: DC | PRN
  Filled 2015-09-18: qty 5

## 2015-09-18 MED ORDER — FENTANYL CITRATE (PF) 250 MCG/5ML IJ SOLN
INTRAMUSCULAR | Status: AC
  Filled 2015-09-18: qty 5

## 2015-09-18 MED ORDER — ENOXAPARIN SODIUM 40 MG/0.4ML ~~LOC~~ SOLN: 40.0000 mg | SUBCUTANEOUS | Status: DC

## 2015-09-18 MED ORDER — ONDANSETRON HCL 4 MG PO TABS: 4.0000 mg | ORAL_TABLET | Freq: Four times a day (QID) | ORAL | Status: DC | PRN

## 2015-09-18 MED ORDER — METOCLOPRAMIDE HCL 5 MG PO TABS: 5.0000 mg | ORAL_TABLET | Freq: Three times a day (TID) | ORAL | Status: DC | PRN

## 2015-09-18 MED ORDER — FENTANYL CITRATE (PF) 100 MCG/2ML IJ SOLN
25.0000 ug | INTRAMUSCULAR | Status: DC | PRN
  Administered 2015-09-18 (×2): 25 ug via INTRAVENOUS

## 2015-09-18 MED ORDER — ENOXAPARIN SODIUM 40 MG/0.4ML ~~LOC~~ SOLN: 40.0000 mg | Freq: Every day | SUBCUTANEOUS | Status: DC

## 2015-09-18 MED ORDER — SUGAMMADEX SODIUM 200 MG/2ML IV SOLN
INTRAVENOUS | Status: AC
  Filled 2015-09-18: qty 2

## 2015-09-18 MED ORDER — ONDANSETRON HCL 4 MG/2ML IJ SOLN
INTRAMUSCULAR | Status: DC | PRN
  Administered 2015-09-18: 4 mg via INTRAVENOUS

## 2015-09-18 MED ORDER — ACETAMINOPHEN 650 MG RE SUPP: 650.0000 mg | Freq: Four times a day (QID) | RECTAL | Status: DC | PRN

## 2015-09-18 MED ORDER — PROPOFOL 10 MG/ML IV BOLUS
INTRAVENOUS | Status: DC | PRN
  Administered 2015-09-18: 40 mg via INTRAVENOUS

## 2015-09-18 MED ORDER — LIDOCAINE HCL (CARDIAC) 20 MG/ML IV SOLN
INTRAVENOUS | Status: DC | PRN
  Administered 2015-09-18: 50 mg via INTRAVENOUS

## 2015-09-18 MED ORDER — SUGAMMADEX SODIUM 200 MG/2ML IV SOLN
INTRAVENOUS | Status: DC | PRN
  Administered 2015-09-18: 99.8 mg via INTRAVENOUS

## 2015-09-18 MED ORDER — PROPOFOL 10 MG/ML IV BOLUS
INTRAVENOUS | Status: AC
  Filled 2015-09-18: qty 20

## 2015-09-18 MED ORDER — ALBUMIN HUMAN 5 % IV SOLN
INTRAVENOUS | Status: AC
  Administered 2015-09-18: 12.5 g via INTRAVENOUS
  Filled 2015-09-18: qty 250

## 2015-09-18 MED ORDER — PHENYLEPHRINE HCL 10 MG/ML IJ SOLN
INTRAMUSCULAR | Status: DC | PRN
  Administered 2015-09-18 (×2): 40 ug via INTRAVENOUS

## 2015-09-18 MED ORDER — OXYCODONE HCL 5 MG PO TABS
5.0000 mg | ORAL_TABLET | ORAL | Status: DC | PRN
  Administered 2015-09-18: 10 mg via ORAL
  Filled 2015-09-18: qty 2

## 2015-09-18 MED ORDER — METOCLOPRAMIDE HCL 5 MG/ML IJ SOLN: 5.0000 mg | Freq: Three times a day (TID) | INTRAMUSCULAR | Status: DC | PRN

## 2015-09-18 MED ORDER — FENTANYL CITRATE (PF) 100 MCG/2ML IJ SOLN
INTRAMUSCULAR | Status: DC | PRN
  Administered 2015-09-18: 50 ug via INTRAVENOUS
  Administered 2015-09-18 (×2): 25 ug via INTRAVENOUS

## 2015-09-18 MED ORDER — ALBUMIN HUMAN 5 % IV SOLN
12.5000 g | Freq: Once | INTRAVENOUS | Status: AC
  Administered 2015-09-18: 12.5 g via INTRAVENOUS

## 2015-09-18 MED ORDER — ROCURONIUM BROMIDE 100 MG/10ML IV SOLN
INTRAVENOUS | Status: DC | PRN
  Administered 2015-09-18: 30 mg via INTRAVENOUS

## 2015-09-18 MED ORDER — FENTANYL CITRATE (PF) 100 MCG/2ML IJ SOLN
INTRAMUSCULAR | Status: AC
  Administered 2015-09-18: 25 ug via INTRAVENOUS
  Filled 2015-09-18: qty 2

## 2015-09-18 MED ORDER — ALUM & MAG HYDROXIDE-SIMETH 200-200-20 MG/5ML PO SUSP: 30.0000 mL | ORAL | Status: DC | PRN

## 2015-09-18 MED ORDER — LACTATED RINGERS IV SOLN
INTRAVENOUS | Status: DC
  Administered 2015-09-18 (×2): via INTRAVENOUS

## 2015-09-18 MED ORDER — PHENYLEPHRINE 40 MCG/ML (10ML) SYRINGE FOR IV PUSH (FOR BLOOD PRESSURE SUPPORT)
PREFILLED_SYRINGE | INTRAVENOUS | Status: AC
  Filled 2015-09-18: qty 10

## 2015-09-18 MED ORDER — ONDANSETRON HCL 4 MG/2ML IJ SOLN: 4.0000 mg | Freq: Four times a day (QID) | INTRAMUSCULAR | Status: DC | PRN

## 2015-09-18 MED ORDER — PHENYLEPHRINE HCL 10 MG/ML IJ SOLN
10.0000 mg | INTRAVENOUS | Status: DC | PRN
  Administered 2015-09-18: 10 ug/min via INTRAVENOUS

## 2015-09-18 MED ORDER — ACETAMINOPHEN 325 MG PO TABS
650.0000 mg | ORAL_TABLET | Freq: Four times a day (QID) | ORAL | Status: DC | PRN
  Administered 2015-09-19 (×2): 650 mg via ORAL
  Filled 2015-09-18 (×2): qty 2

## 2015-09-18 MED ORDER — 0.9 % SODIUM CHLORIDE (POUR BTL) OPTIME
TOPICAL | Status: DC | PRN
  Administered 2015-09-18: 1000 mL

## 2015-09-18 SURGICAL SUPPLY — 45 items
BLADE SURG 15 STRL LF DISP TIS (BLADE) ×1 IMPLANT
BLADE SURG 15 STRL SS (BLADE) ×3
BNDG COHESIVE 4X5 TAN NS LF (GAUZE/BANDAGES/DRESSINGS) ×7 IMPLANT
BNDG COHESIVE 6X5 TAN STRL LF (GAUZE/BANDAGES/DRESSINGS) IMPLANT
BNDG GAUZE ELAST 4 BULKY (GAUZE/BANDAGES/DRESSINGS) ×1 IMPLANT
COVER PERINEAL POST (MISCELLANEOUS) ×3 IMPLANT
COVER SURGICAL LIGHT HANDLE (MISCELLANEOUS) ×3 IMPLANT
DRAPE PROXIMA HALF (DRAPES) IMPLANT
DRAPE STERI IOBAN 125X83 (DRAPES) ×3 IMPLANT
DRSG MEPILEX BORDER 4X4 (GAUZE/BANDAGES/DRESSINGS) ×5 IMPLANT
DRSG MEPILEX BORDER 4X8 (GAUZE/BANDAGES/DRESSINGS) ×3 IMPLANT
DRSG PAD ABDOMINAL 8X10 ST (GAUZE/BANDAGES/DRESSINGS) ×6 IMPLANT
DURAPREP 26ML APPLICATOR (WOUND CARE) ×3 IMPLANT
ELECT CAUTERY BLADE 6.4 (BLADE) ×3 IMPLANT
ELECT REM PT RETURN 9FT ADLT (ELECTROSURGICAL) ×3
ELECTRODE REM PT RTRN 9FT ADLT (ELECTROSURGICAL) ×1 IMPLANT
FACESHIELD WRAPAROUND (MASK) ×3 IMPLANT
FACESHIELD WRAPAROUND OR TEAM (MASK) ×1 IMPLANT
GAUZE XEROFORM 5X9 LF (GAUZE/BANDAGES/DRESSINGS) ×3 IMPLANT
GLOVE SKINSENSE NS SZ7.5 (GLOVE) ×4
GLOVE SKINSENSE STRL SZ7.5 (GLOVE) ×2 IMPLANT
GOWN STRL REIN XL XLG (GOWN DISPOSABLE) ×3 IMPLANT
GUIDE PIN 3.2X343 (PIN) ×1
GUIDE PIN 3.2X343MM (PIN) ×3
KIT BASIN OR (CUSTOM PROCEDURE TRAY) ×3 IMPLANT
KIT ROOM TURNOVER OR (KITS) ×3 IMPLANT
LINER BOOT UNIVERSAL DISP (MISCELLANEOUS) ×3 IMPLANT
MANIFOLD NEPTUNE II (INSTRUMENTS) ×5 IMPLANT
MANIFOLD NEPTUNE WASTE (CANNULA) ×2 IMPLANT
NAIL RIGHT 10X38MM (Nail) ×2 IMPLANT
NS IRRIG 1000ML POUR BTL (IV SOLUTION) ×3 IMPLANT
PACK GENERAL/GYN (CUSTOM PROCEDURE TRAY) ×3 IMPLANT
PAD ARMBOARD 7.5X6 YLW CONV (MISCELLANEOUS) ×6 IMPLANT
PAD CAST 4YDX4 CTTN HI CHSV (CAST SUPPLIES) ×2 IMPLANT
PADDING CAST COTTON 4X4 STRL (CAST SUPPLIES) ×6
PIN GUIDE 3.2X343MM (PIN) IMPLANT
SCREW LAG COMPR KIT 90/85 (Screw) ×2 IMPLANT
STAPLER VISISTAT 35W (STAPLE) ×5 IMPLANT
SUT VIC AB 0 CT1 27 (SUTURE) ×6
SUT VIC AB 0 CT1 27XBRD ANBCTR (SUTURE) ×2 IMPLANT
SUT VIC AB 2-0 CT1 27 (SUTURE) ×6
SUT VIC AB 2-0 CT1 TAPERPNT 27 (SUTURE) ×2 IMPLANT
TOWEL OR 17X24 6PK STRL BLUE (TOWEL DISPOSABLE) ×3 IMPLANT
TOWEL OR 17X26 10 PK STRL BLUE (TOWEL DISPOSABLE) ×3 IMPLANT
WATER STERILE IRR 1000ML POUR (IV SOLUTION) ×1 IMPLANT

## 2015-09-18 NOTE — Anesthesia Postprocedure Evaluation (Signed)
Anesthesia Post Note  Patient: Christina Summers  Procedure(s) Performed: Procedure(s) (LRB): INTRAMEDULLARY (IM) NAIL INTERTROCHANTRIC FEMUR FRACTURE (Right)  Patient location during evaluation: PACU Anesthesia Type: General Level of consciousness: awake and alert Pain management: pain level controlled Vital Signs Assessment: post-procedure vital signs reviewed and stable Respiratory status: spontaneous breathing, nonlabored ventilation, respiratory function stable and patient connected to nasal cannula oxygen Cardiovascular status: blood pressure returned to baseline and stable Postop Assessment: no signs of nausea or vomiting Anesthetic complications: no    Last Vitals:  Filed Vitals:   09/18/15 1241 09/18/15 1254  BP: 87/50 137/66  Pulse: 79 110  Temp:    Resp: 16 14    Last Pain:  Filed Vitals:   09/18/15 1303  PainSc: 5                  [employer],W. EDMOND

## 2015-09-18 NOTE — Transfer of Care (Signed)
Immediate Anesthesia Transfer of Care Note  Patient: Christina Summers  Procedure(s) Performed: Procedure(s): INTRAMEDULLARY (IM) NAIL INTERTROCHANTRIC FEMUR FRACTURE (Right)  Patient Location: PACU  Anesthesia Type:General  Level of Consciousness: awake, alert  and sedated  Airway & Oxygen Therapy: Patient connected to face mask oxygen  Post-op Assessment: Post -op Vital signs reviewed and stable  Post vital signs: stable  Last Vitals:  Filed Vitals:   09/17/15 2136 09/18/15 0624  BP: 173/89 116/71  Pulse: 98 88  Temp: 36.3 C 36.4 C  Resp: 18 16    Last Pain:  Filed Vitals:   09/18/15 0645  PainSc: Asleep         Complications: No apparent anesthesia complications

## 2015-09-18 NOTE — Anesthesia Preprocedure Evaluation (Addendum)
Anesthesia Evaluation  Patient identified by MRN, date of birth, ID band Patient awake    Reviewed: Allergy & Precautions, H&P , NPO status , Patient's Chart, lab work & pertinent test results  Airway Mallampati: II  TM Distance: >3 FB Neck ROM: Full    Dental no notable dental hx. (+) Teeth Intact, Dental Advisory Given   Pulmonary neg pulmonary ROS,    Pulmonary exam normal breath sounds clear to auscultation       Cardiovascular negative cardio ROS   Rhythm:Regular Rate:Normal     Neuro/Psych Anxiety Dementia CVA, Residual Symptoms    GI/Hepatic negative GI ROS, Neg liver ROS,   Endo/Other  negative endocrine ROS  Renal/GU negative Renal ROS  negative genitourinary   Musculoskeletal   Abdominal   Peds  Hematology negative hematology ROS (+)   Anesthesia Other Findings   Reproductive/Obstetrics negative OB ROS                            Anesthesia Physical Anesthesia Plan  ASA: III  Anesthesia Plan: General   Post-op Pain Management:    Induction: Intravenous  Airway Management Planned: Oral ETT  Additional Equipment:   Intra-op Plan:   Post-operative Plan: Extubation in OR  Informed Consent: I have reviewed the patients History and Physical, chart, labs and discussed the procedure including the risks, benefits and alternatives for the proposed anesthesia with the patient or authorized representative who has indicated his/her understanding and acceptance.   Dental advisory given  Plan Discussed with: CRNA  Anesthesia Plan Comments:         Anesthesia Quick Evaluation

## 2015-09-18 NOTE — Progress Notes (Signed)
PROGRESS NOTE                                                                                                                                                                                                             Patient Demographics:    Christina Summers, is a 82 y.o. female, DOB - 01-07-1925, [id number]  Admit date - 09/17/2015   Admitting Physician Waldemar Dickens, MD  Outpatient Primary MD for the patient is  Melinda Crutch, MD  LOS - 1   Chief Complaint  Patient presents with  . Fall       Brief Narrative   82 year old female with history of dementia, stroke, limits at SNF, presents with fall, sustained right hip fracture, status post surgical repair by Dr. Erlinda Hong on 6/23   Subjective:    Christina Summers With significant dementia, cannot provide any complaints  Assessment  & Plan :    Active Problems:   Dementia with behavioral disturbance   CVA (cerebral infarction)   Fracture, intertrochanteric, right femur (HCC)    Right intertrochanteric femur fracture - due to  unwitnessed fall at SNF - Status post surgical repair by Dr.Xu on 6/23 - on when necessary pain medication - PT to see  History of CVA - Continue with aspirin  dementia with behavioral disturbances -  continue with home dose when necessary clonazepam, would have bedside sitter if needed   Code Status : DNR  Family Communication  : none at bedside  Disposition Plan  : back to SNF in 1-2 days  Consults  :  Ortho  Procedures  : Treatment of intertrochanteric fracture with intramedullary implant by Dr Erlinda Hong on 6/23  DVT Prophylaxis  :  Lovenox , SCD  Lab Results  Component Value Date   PLT 140* 09/18/2015    Antibiotics  :    Anti-infectives    Start     Dose/Rate Route Frequency Ordered Stop   09/18/15 1345  ceFAZolin (ANCEF) IVPB 2g/100 mL premix     2 g 200 mL/hr over 30 Minutes Intravenous Every 6 hours 09/18/15 1341 09/19/15 0744   09/18/15 0900   ceFAZolin (ANCEF) IVPB 2g/100 mL premix     2 g 200 mL/hr over 30 Minutes Intravenous To [facility] 09/17/15 1958 09/18/15 1047        Objective:  Filed Vitals:   09/18/15 1226 09/18/15 1229 09/18/15 1241 09/18/15 1254  BP: 82/49 88/54 87/50  137/66  Pulse: 74 78 79 110  Temp:      TempSrc:      Resp: 15 15 16 14   Weight:      SpO2: 93% 94% 94% 100%    Wt Readings from Last 3 Encounters:  09/17/15 49.896 kg (110 lb)  09/17/15 49.896 kg (110 lb)  11/15/14 49.896 kg (110 lb)     Intake/Output Summary (Last 24 hours) at 09/18/15 1522 Last data filed at 09/18/15 1300  Gross per 24 hour  Intake    600 ml  Output    275 ml  Net    325 ml     Physical Exam  Awake Alert,Confused Supple Neck,No JVD, Symmetrical Chest wall movement, Good air movement bilaterally,  RRR,No Gallops,Rubs or new Murmurs, No Parasternal Heave +ve B.Sounds, Abd Soft, No tenderness. No Cyanosis, Clubbing or edema, No new Rash or bruise      Data Review:    CBC  Recent Labs Lab 09/17/15 1404 09/18/15 0600  WBC 12.0* 9.1  HGB 14.7 13.4  HCT 44.4 41.1  PLT 149* 140*  MCV 100.9* 99.8  MCH 33.4 32.5  MCHC 33.1 32.6  RDW 13.3 13.3  LYMPHSABS 0.9  --   MONOABS 0.7  --   EOSABS 0.0  --   BASOSABS 0.0  --     Chemistries   Recent Labs Lab 09/17/15 1404 09/18/15 0600  NA 139 139  K 4.2 3.7  CL 99* 101  CO2 30 30  GLUCOSE 140* 155*  BUN 12 8  CREATININE 0.64 0.66  CALCIUM 9.0 8.9   ------------------------------------------------------------------------------------------------------------------ No results for input(s): CHOL, HDL, LDLCALC, TRIG, CHOLHDL, LDLDIRECT in the last 72 hours.  Lab Results  Component Value Date   HGBA1C 6.3* 07/16/2012   ------------------------------------------------------------------------------------------------------------------ No results for input(s): TSH, T4TOTAL, T3FREE, THYROIDAB in the last 72 hours.  Invalid input(s):  FREET3 ------------------------------------------------------------------------------------------------------------------ No results for input(s): VITAMINB12, FOLATE, FERRITIN, TIBC, IRON, RETICCTPCT in the last 72 hours.  Coagulation profile No results for input(s): INR, PROTIME in the last 168 hours.  No results for input(s): DDIMER in the last 72 hours.  Cardiac Enzymes  Recent Labs Lab 09/17/15 1404  TROPONINI <0.03   ------------------------------------------------------------------------------------------------------------------ No results found for: BNP  Inpatient Medications  Scheduled Meds: . [MAR Hold] aspirin EC  81 mg Oral Daily  .  ceFAZolin (ANCEF) IV  2 g Intravenous Q6H  . [MAR Hold] divalproex  125 mg Oral BID  . [MAR Hold] docusate sodium  100 mg Oral BID  . [MAR Hold] donepezil  10 mg Oral Daily  . [MAR Hold] enoxaparin (LOVENOX) injection  30 mg Subcutaneous Q24H  . [START ON 09/19/2015] enoxaparin (LOVENOX) injection  40 mg Subcutaneous Q24H  . [MAR Hold] sertraline  75 mg Oral Daily   Continuous Infusions: . sodium chloride    . dextrose 5 % and 0.45% NaCl 50 mL/hr at 09/17/15 1838  . lactated ringers Stopped (09/18/15 1127)   PRN Meds:.acetaminophen **OR** acetaminophen, [MAR Hold] acetaminophen, alum & mag hydroxide-simeth, [MAR Hold] clonazePAM, fentaNYL (SUBLIMAZE) injection, [MAR Hold] HYDROcodone-acetaminophen, HYDROcodone-acetaminophen, menthol-cetylpyridinium **OR** phenol, methocarbamol **OR** methocarbamol (ROBAXIN)  IV, metoCLOPramide **OR** metoCLOPramide (REGLAN) injection, [MAR Hold]  morphine injection, morphine injection, ondansetron **OR** ondansetron (ZOFRAN) IV, oxyCODONE, [MAR Hold] polyethylene glycol  Micro Results Recent Results (from the past 240 hour(s))  MRSA PCR Screening     Status: None  Collection Time: 09/17/15  6:20 PM  Result Value Ref Range Status   MRSA by PCR NEGATIVE NEGATIVE Final    Comment:        The GeneXpert  MRSA Assay (FDA approved for NASAL specimens only), is one component of a comprehensive MRSA colonization surveillance program. It is not intended to diagnose MRSA infection nor to guide or monitor treatment for MRSA infections.     Radiology Reports Ct Hip Right Wo Contrast  09/17/2015  CLINICAL DATA:  Fall at nursing home, right hip pain. EXAM: CT OF THE RIGHT HIP WITHOUT CONTRAST TECHNIQUE: Multidetector CT imaging of the right hip was performed according to the standard protocol. Multiplanar CT image reconstructions were also generated. COMPARISON:  Radiographs from 09/17/2015 FINDINGS: Nondisplaced intertrochanteric fracture. There is a mildly displaced greater trochanteric fragment but the extension over to the lesser trochanter is subtle. Visualized right hemipelvis unremarkable. Mild spurring of the acetabulum and femoral head. Vascular calcifications noted. Direct inguinal hernia contains adipose tissue. Abnormal subcutaneous bruising adjacent to the greater trochanter. IMPRESSION: 1. Nondisplaced intertrochanteric fracture, also including a mildly displaced transverse greater trochanteric fragment. 2. Direct right inguinal hernia contains adipose tissue. 3. Abnormal subcutaneous hematoma/bruising adjacent to the greater trochanter. Electronically Signed   By: Van Clines M.D.   On: 09/17/2015 13:39   Dg Chest Port 1 View  09/17/2015  CLINICAL DATA:  Jerelyn Scott twice today. EXAM: PORTABLE CHEST 1 VIEW COMPARISON:  04/05/2015 and 01/17/2014 FINDINGS: Lungs are adequately inflated without focal consolidation or effusion. Cardiomediastinal silhouette is within normal. There is calcified plaque over the thoracic aorta. Old bilateral rib fractures and possible acute to subacute posterior lateral right ninth rib fracture. Degenerative change of the spine. IMPRESSION: No acute cardiopulmonary disease. Old bilateral rib fractures. Acute to subacute right posterior lateral ninth rib fracture.  Aortic atherosclerosis. Electronically Signed   By: Marin Olp M.D.   On: 09/17/2015 14:15   Dg Knee Complete 4 Views Right  09/17/2015  CLINICAL DATA:  Status post fall this morning with a right knee injury. Pain. Initial encounter. EXAM: RIGHT KNEE - COMPLETE 4+ VIEW COMPARISON:  None. FINDINGS: No evidence of fracture, dislocation, or joint effusion. No evidence of arthropathy or other focal bone abnormality. Atherosclerotic vascular disease noted. IMPRESSION: No acute abnormality. Atherosclerosis. Electronically Signed   By: Inge Rise M.D.   On: 09/17/2015 10:05   Dg Hip Operative Unilat W Or W/o Pelvis Right  09/18/2015  CLINICAL DATA:  IM nail to right femur EXAM: OPERATIVE RIGHT HIP (WITH PELVIS IF PERFORMED) 2 VIEWS TECHNIQUE: Fluoroscopic spot image(s) were submitted for interpretation post-operatively. COMPARISON:  09/17/2015 FINDINGS: Internal fixation across the right intertrochanteric fracture. Anatomic alignment. No hardware bony complicating feature. IMPRESSION: Internal fixation.  No visible complicating feature. Electronically Signed   By: Rolm Baptise M.D.   On: 09/18/2015 11:24   Dg Hip Unilat With Pelvis 2-3 Views Right  09/17/2015  CLINICAL DATA:  Status post fall this morning.  Right hip pain. EXAM: DG HIP (WITH OR WITHOUT PELVIS) 2-3V RIGHT COMPARISON:  None. FINDINGS: There is no evidence of hip fracture or dislocation. Mild bilateral hip degenerative disease is seen. Atherosclerosis is noted. IMPRESSION: No acute abnormality. Mild bilateral hip degenerative change. Electronically Signed   By: Inge Rise M.D.   On: 09/17/2015 08:39    Time Spent in minutes  25 minutes   ELGERGAWY, DAWOOD M.D on 09/18/2015 at 3:22 PM  Between 7am to 7pm - Pager - [phone]  After 7pm go  to [email or web address] - password [facility]  [facility] -  Office  [phone]

## 2015-09-18 NOTE — Discharge Instructions (Signed)
° ° °  1. Change dressings as needed °2. May shower but keep incisions covered and dry °3. Take lovenox to prevent blood clots °4. Take stool softeners as needed °5. Take pain meds as needed ° °

## 2015-09-18 NOTE — Progress Notes (Signed)
PT Cancellation and Discharge Note  Patient Details Name: Christina Summers MRN: [id number] DOB: Jul 19, 1924   Cancelled Treatment:    Reason Eval/Treat Not Completed: Patient not medically ready.  Noted pt is on OR schedule today for repair of Femur fx.  Will sign off PT at this time and need new orders post-op.     Dudley Cooley, Thornton Papas 09/18/2015, 8:06 AM

## 2015-09-18 NOTE — Progress Notes (Signed)
Daughter in law Freda Munro contacted as next of kin to obtain verbal consent for pt to have scheduled surgery today. Daughter in law stated that she shares POA with her husband (who is working out of town today). Charge nurse Ethelda Chick, RN and this scriber both verified that verbal consent was obtained from daughter in law for [place] to have surgery this date. Daughter in law requested that Dr. Erlinda Hong call her prior to starting the case. Dr. Erlinda Hong currently on the unit and was given this message to contact the daughter in law. Physician agreed he would contact family.

## 2015-09-18 NOTE — Op Note (Signed)
   Date of Surgery: 09/18/2015  INDICATIONS: [name] is a 82 y.o.-year-old female who sustained a right hip fracture. The risks and benefits of the procedure discussed with the power of attorney prior to the procedure and all questions were answered; consent was obtained.  PREOPERATIVE DIAGNOSIS: right hip fracture   POSTOPERATIVE DIAGNOSIS: Same   PROCEDURE: Treatment of intertrochanteric fracture with intramedullary implant. CPT [phone]   SURGEON: N. Eduard Roux, M.D.   ANESTHESIA: general   IV FLUIDS AND URINE: See anesthesia record   ESTIMATED BLOOD LOSS: 100 cc  IMPLANTS: [employer] InterTAN 10 x 38, 90/85  DRAINS: None.   COMPLICATIONS: None.   DESCRIPTION OF PROCEDURE: The patient was brought to the operating room and placed supine on the operating table. The patient's leg had been signed prior to the procedure. The patient had the anesthesia placed by the anesthesiologist. The prep verification and incision time-outs were performed to confirm that this was the correct patient, site, side and location. The patient had an SCD on the opposite lower extremity. The patient did receive antibiotics prior to the incision and was re-dosed during the procedure as needed at indicated intervals. The patient was positioned on the fracture table with the table in traction and internal rotation to reduce the hip. The well leg was placed in a scissor position and all bony prominences were well-padded. The patient had the lower extremity prepped and draped in the standard surgical fashion. The incision was made 4 finger breadths superior to the greater trochanter. A guide pin was inserted into the tip of the greater trochanter under fluoroscopic guidance. An opening reamer was used to gain access to the femoral canal. The nail length was measured and inserted down the femoral canal to its proper depth. The appropriate version of insertion for the lag screw was found under fluoroscopy. A pin  was inserted up the femoral neck through the jig. Then, a second antirotation pin was inserted inferior to the first pin. The length of the lag screw was then measured. The lag screw was inserted as near to center-center in the head as possible. The antirotation pin was then taken out and an interdigitating compression screw was placed in its place. The leg was taken out of traction, then the interdigitating compression screw was used to compress across the fracture. Compression was visualized on serial xrays. The wound was copiously irrigated with saline and the subcutaneous layer closed with 2.0 vicryl and the skin was reapproximated with staples. The wounds were cleaned and dried a final time and a sterile dressing was placed. The hip was taken through a range of motion at the end of the case under fluoroscopic imaging to visualize the approach-withdraw phenomenon and confirm implant length in the head. The patient was then awakened from anesthesia and taken to the recovery room in stable condition. All counts were correct at the end of the case.   POSTOPERATIVE PLAN: The patient will be weight bearing as tolerated and will return in 2 weeks for staple removal and the patient will receive DVT prophylaxis based on other medications, activity level, and risk ratio of bleeding to thrombosis.   Christina Cecil, MD [place] 11:06 AM

## 2015-09-18 NOTE — Consult Note (Signed)
ORTHOPAEDIC CONSULTATION  REQUESTING PHYSICIAN: Albertine Patricia, MD  Chief Complaint: Right hip fracture  HPI: Christina Summers is a 82 y.o. female who presents with right hip fracture s/p unwitnessed fall.  The patient has severe dementia.  History is daughter in law.  Does not live independently.  Ortho consulted.  Past Medical History  Diagnosis Date  . Insomnia, unspecified   . Hard of hearing 06/25/2012  . Stroke ([place])     tia  . Cancer ([place])     skin  . Dementia   . Dementia with behavioral disturbance 06/25/2012  . Anxiety    Past Surgical History  Procedure Laterality Date  . No past surgeries    . Excision melanoma with sentinel lymph node biopsy Left 10/07/2013    Procedure:  LEFT UPPER EXTREMITY EXCISIONAL BIOPSY;  Surgeon: Ralene Ok, MD;  Location: [facility];  Service: General;  Laterality: Left;   Social History   Social History  . Marital Status: Widowed    Spouse Name: N/A  . Number of Children: 1  . Years of Education: hs   Social History Main Topics  . Smoking status: Never Smoker   . Smokeless tobacco: Never Used  . Alcohol Use: No  . Drug Use: No  . Sexual Activity: No   Other Topics Concern  . None   Social History Narrative   Patient is widowed and lives with her granddaughter Jonelle Sidle).   Patient is retired.   Patient has one son.   Patient does not drink any caffeine.   Patient is right-handed.   Patient has a high school education.   Family History  Problem Relation Age of Onset  . Other Mother   . Other Father   . Heart attack Father    No Known Allergies Prior to Admission medications   Medication Sig Start Date End Date Taking? Authorizing Provider  acetaminophen (TYLENOL) 500 MG tablet Take 2 tablets (1,000 mg total) by mouth every 6 (six) hours as needed for moderate pain. 09/17/15  Yes Blanchie Dessert, MD  aspirin 81 MG tablet Take 81 mg by mouth daily.   Yes Historical Provider, MD  Cholecalciferol (VITAMIN D3) 1000  units CAPS Take by mouth daily.   Yes Historical Provider, MD  clonazePAM (KLONOPIN) 0.5 MG tablet Take 0.5 mg by mouth 2 (two) times daily as needed for anxiety.   Yes Historical Provider, MD  divalproex (DEPAKOTE SPRINKLE) 125 MG capsule Take 125 mg by mouth 2 (two) times daily.   Yes Historical Provider, MD  donepezil (ARICEPT) 10 MG tablet Take 1 tablet (10 mg total) by mouth daily. 07/29/13  Yes Dennie Bible, NP  neomycin-bacitracin-polymyxin (NEOSPORIN) [phone] ointment Apply 1 application topically daily as needed.   Yes Historical Provider, MD  sertraline (ZOLOFT) 50 MG tablet Take 75 mg by mouth daily.   Yes Historical Provider, MD   Ct Hip Right Wo Contrast  09/17/2015  CLINICAL DATA:  Fall at nursing home, right hip pain. EXAM: CT OF THE RIGHT HIP WITHOUT CONTRAST TECHNIQUE: Multidetector CT imaging of the right hip was performed according to the standard protocol. Multiplanar CT image reconstructions were also generated. COMPARISON:  Radiographs from 09/17/2015 FINDINGS: Nondisplaced intertrochanteric fracture. There is a mildly displaced greater trochanteric fragment but the extension over to the lesser trochanter is subtle. Visualized right hemipelvis unremarkable. Mild spurring of the acetabulum and femoral head. Vascular calcifications noted. Direct inguinal hernia contains adipose tissue. Abnormal subcutaneous bruising adjacent to the greater trochanter.  IMPRESSION: 1. Nondisplaced intertrochanteric fracture, also including a mildly displaced transverse greater trochanteric fragment. 2. Direct right inguinal hernia contains adipose tissue. 3. Abnormal subcutaneous hematoma/bruising adjacent to the greater trochanter. Electronically Signed   By: Van Clines M.D.   On: 09/17/2015 13:39   Dg Chest Port 1 View  09/17/2015  CLINICAL DATA:  Jerelyn Scott twice today. EXAM: PORTABLE CHEST 1 VIEW COMPARISON:  04/05/2015 and 01/17/2014 FINDINGS: Lungs are adequately inflated without focal  consolidation or effusion. Cardiomediastinal silhouette is within normal. There is calcified plaque over the thoracic aorta. Old bilateral rib fractures and possible acute to subacute posterior lateral right ninth rib fracture. Degenerative change of the spine. IMPRESSION: No acute cardiopulmonary disease. Old bilateral rib fractures. Acute to subacute right posterior lateral ninth rib fracture. Aortic atherosclerosis. Electronically Signed   By: Marin Olp M.D.   On: 09/17/2015 14:15   Dg Knee Complete 4 Views Right  09/17/2015  CLINICAL DATA:  Status post fall this morning with a right knee injury. Pain. Initial encounter. EXAM: RIGHT KNEE - COMPLETE 4+ VIEW COMPARISON:  None. FINDINGS: No evidence of fracture, dislocation, or joint effusion. No evidence of arthropathy or other focal bone abnormality. Atherosclerotic vascular disease noted. IMPRESSION: No acute abnormality. Atherosclerosis. Electronically Signed   By: Inge Rise M.D.   On: 09/17/2015 10:05   Dg Hip Unilat With Pelvis 2-3 Views Right  09/17/2015  CLINICAL DATA:  Status post fall this morning.  Right hip pain. EXAM: DG HIP (WITH OR WITHOUT PELVIS) 2-3V RIGHT COMPARISON:  None. FINDINGS: There is no evidence of hip fracture or dislocation. Mild bilateral hip degenerative disease is seen. Atherosclerosis is noted. IMPRESSION: No acute abnormality. Mild bilateral hip degenerative change. Electronically Signed   By: Inge Rise M.D.   On: 09/17/2015 08:39    All pertinent xrays, MRI, CT independently reviewed and interpreted  Positive ROS: All other systems have been reviewed and were otherwise negative with the exception of those mentioned in the HPI and as above.  Physical Exam: General: Demented Cardiovascular: No pedal edema Respiratory: No cyanosis, no use of accessory musculature GI: No organomegaly, abdomen is soft and non-tender Skin: No lesions in the area of chief complaint Neurologic: Sensation intact  distally Psychiatric: Patient has severe dementia Lymphatic: No axillary or cervical lymphadenopathy  MUSCULOSKELETAL:  - severe pain with movement of the hip and extremity - skin intact - NVI distally - compartments soft  Assessment: Right intertroch hip fracture  Plan: - surgery is recommended, family is aware of r/b/a and wish to proceed - consent obtained from [facility] - medical optimization per primary team - surgery is planned for today - Based on history and fracture pattern this likely represents a fragility fracture. - Fragility fractures affect up to one half of women and one third of men after age 64 years and occur in the setting of bone disorder such as osteoporosis or osteopenia and warrant appropriate work-up. - The following are general recommendations that may serve as an outline for an appropriate work-up:  1.) Obtain bone density measurement to confirm presumptive diagnosis, assess severity of osteoporosis and risk of future fracture, and use as baseline for monitoring treatment  2.) Obtain laboratory tests: CBC, ESR, serum calcium, creatinine, albumin,phosphate, alkaline phosphatase, liver transaminases, protein electrophoresis, urinalysis, 25-hydroxyvitamin D.  3.) Exclude secondary causes of low bone mass and skeletal fragility (eg,multiple myeloma, lymphoma) as indicated.  4.) Obtain radiograph of thoracic and lumbar spine, particularly among individuals with back pain or height loss  to assess presence of vertebral fractures  5.) Intermittent administration of recombinant human parathyroid hormone  6.) Optimize nutritional status using nutritional supplementation.  7.) Patient/family education to prevent future falls.  8.) Early mobilization and exercise program - exercise decreases the rate of bone loss and has been associated with decreased rate of fragility fractures   Thank you for the consult and the opportunity to see Ms. Encina  N. Eduard Roux,  MD [facility] [phone] 9:16 AM

## 2015-09-18 NOTE — Anesthesia Procedure Notes (Signed)
Procedure Name: Intubation Date/Time: 09/18/2015 10:17 AM Performed by: Lavell Luster Pre-anesthesia Checklist: Patient identified, Emergency Drugs available, Suction available, Patient being monitored and Timeout performed Patient Re-evaluated:Patient Re-evaluated prior to inductionOxygen Delivery Method: Circle system utilized Preoxygenation: Pre-oxygenation with 100% oxygen Intubation Type: Inhalational induction with existing ETT Ventilation: Mask ventilation without difficulty Laryngoscope Size: Mac and 3 Grade View: Grade I Tube type: Oral Tube size: 7.0 mm Number of attempts: 1 Airway Equipment and Method: Stylet Placement Confirmation: ETT inserted through vocal cords under direct vision,  positive ETCO2 and breath sounds checked- equal and bilateral Secured at: 21 cm Tube secured with: Tape Dental Injury: Teeth and Oropharynx as per pre-operative assessment

## 2015-09-19 LAB — BASIC METABOLIC PANEL
ANION GAP: 11 (ref 5–15)
BUN: 11 mg/dL (ref 6–20)
CALCIUM: 8.6 mg/dL — AB (ref 8.9–10.3)
CO2: 24 mmol/L (ref 22–32)
CREATININE: 0.59 mg/dL (ref 0.44–1.00)
Chloride: 101 mmol/L (ref 101–111)
GFR calc Af Amer: >60 mL/min (ref >60)
GLUCOSE: 152 mg/dL — AB (ref 65–99)
Potassium: 4.4 mmol/L (ref 3.5–5.1)
Sodium: 136 mmol/L (ref 135–145)

## 2015-09-19 LAB — ABO/RH: ABO/RH(D): O POS

## 2015-09-19 LAB — CBC
HCT: 24.5 % — ABNORMAL LOW (ref 36.0–46.0)
Hemoglobin: 8.1 g/dL — ABNORMAL LOW (ref 12.0–15.0)
MCH: 32.4 pg (ref 26.0–34.0)
MCHC: 33.1 g/dL (ref 30.0–36.0)
MCV: 98 fL (ref 78.0–100.0)
PLATELETS: 141 10*3/uL — AB (ref 150–400)
RBC: 2.5 MIL/uL — ABNORMAL LOW (ref 3.87–5.11)
RDW: 13.6 % (ref 11.5–15.5)
WBC: 9.8 10*3/uL (ref 4.0–10.5)

## 2015-09-19 LAB — PREPARE RBC (CROSSMATCH)

## 2015-09-19 MED ORDER — ENOXAPARIN SODIUM 30 MG/0.3ML ~~LOC~~ SOLN
30.0000 mg | SUBCUTANEOUS | Status: DC
  Administered 2015-09-19 – 2015-09-20 (×2): 30 mg via SUBCUTANEOUS
  Filled 2015-09-19 (×2): qty 0.3

## 2015-09-19 MED ORDER — SODIUM CHLORIDE 0.9 % IV SOLN: Freq: Once | INTRAVENOUS | Status: DC

## 2015-09-19 NOTE — Progress Notes (Signed)
Subjective: 1 Day Post-Op Procedure(s) (LRB): INTRAMEDULLARY (IM) NAIL INTERTROCHANTRIC FEMUR FRACTURE (Right) Tolerated surgery well yesterday.  Nursing at the bedside due to her confusion.  Acute blood loss anemia from her fracture and surgery. Vitals stable.  Objective: Vital signs in last 24 hours: Temp:  [97.4 F (36.3 C)-98.7 F (37.1 C)] 98.7 F (37.1 C) (06/24 0511) Pulse Rate:  [68-110] 99 (06/24 0511) Resp:  [14-19] 14 (06/23 1254) BP: (82-137)/(49-85) 105/58 mmHg (06/24 0511) SpO2:  [93 %-100 %] 98 % (06/24 0511)  Intake/Output from previous day: 06/23 0701 - 06/24 0700 In: 1100 [P.O.:250; I.V.:850] Out: 525 [Urine:375; Blood:150] Intake/Output this shift:     Recent Labs  09/17/15 1404 09/18/15 0600 09/19/15 0546  HGB 14.7 13.4 8.1*    Recent Labs  09/18/15 0600 09/19/15 0546  WBC 9.1 9.8  RBC 4.12 2.50*  HCT 41.1 24.5*  PLT 140* 141*    Recent Labs  09/18/15 0600 09/19/15 0546  NA 139 136  K 3.7 4.4  CL 101 101  CO2 30 24  BUN 8 11  CREATININE 0.66 0.59  GLUCOSE 155* 152*  CALCIUM 8.9 8.6*   No results for input(s): LABPT, INR in the last 72 hours.  Incision: scant drainage  Assessment/Plan: 1 Day Post-Op Procedure(s) (LRB): INTRAMEDULLARY (IM) NAIL INTERTROCHANTRIC FEMUR FRACTURE (Right) Up with therapy Discharge to SNF when medically clear. Follow H&H  Danahi Reddish Y 09/19/2015, 7:54 AM

## 2015-09-19 NOTE — Progress Notes (Addendum)
OT Cancellation Note  Patient Details Name: Christina Summers MRN: [id number] DOB: November 29, 1924   Cancelled Treatment:    Reason Eval/Treat Not Completed: Other (comment) Pt is a resident at a SNF and planned discharge disposition is SNF - will defer OT evaluation to next venue of care. Please re-consult if needs or situation changes.   Redmond Baseman, OTR/L Pager: [phone] 09/19/2015, 12:55 PM

## 2015-09-19 NOTE — Care Management Note (Signed)
Case Management Note  Patient Details  Name: Christina Summers MRN: [id number] Date of Birth: [birth year]  Subjective/Objective: 82 yo F with hx of dementia, stroke, lives at SNF, presents with fall, sustained R hip fx. Tx of intertrochanteric fx with intramedullary implant.                 Action/Plan: SNF   Expected Discharge Date:                  Expected Discharge Plan:  Skilled Nursing Facility  In-House Referral:  Clinical Social Work  Discharge planning Services  CM Consult  Post Acute Care Choice:    Choice offered to:     DME Arranged:    DME Agency:     HH Arranged:    [facility] Agency:     Status of Service:  Completed, signed off  If discussed at [employer] [facility] [employer], dates discussed:    Additional Comments: Pt resides at SNF and D/C plan is to return to SNF. SW to f/u to assist with SNF.  Norina Buzzard, RN 09/19/2015, 3:23 PM

## 2015-09-19 NOTE — Progress Notes (Signed)
PROGRESS NOTE                                                                                                                                                                                                             Patient Demographics:    Christina Summers, is a 82 y.o. female, DOB - [birth year], [id number]  Admit date - 09/17/2015   Admitting Physician Waldemar Dickens, MD  Outpatient Primary MD for the patient is  Melinda Crutch, MD  LOS - 2   Chief Complaint  Patient presents with  . Fall       Brief Narrative   82 year old female with history of dementia, stroke,lives at SNF, presents with fall, sustained right hip fracture, status post surgical repair by Dr. Erlinda Hong on 6/23   Subjective:    Christina Summers With significant dementia, cannot provide any complaints  Assessment  & Plan :    Active Problems:   Dementia with behavioral disturbance   CVA (cerebral infarction)   Fracture, intertrochanteric, right femur (HCC)    Right intertrochanteric femur fracture - due to  unwitnessed fall at SNF - Status post surgical repair by Dr.Xu on 6/23 - on when necessary pain medication - PT to see  History of CVA - Continue with aspirin  dementia with behavioral disturbances -  continue with home medication  And when necessary clonazepam, would have bedside sitter if needed  Postoperative anemia - We'll transfuse 1 unit PRBC today, check CBC in a.m.   Code Status : DNR  Family Communication  : none at bedside  Disposition Plan  : back to SNF in 1-2 days  Consults  :  Ortho  Procedures  : Treatment of intertrochanteric fracture with intramedullary implant by Dr Erlinda Hong on 6/23  DVT Prophylaxis  :  Lovenox , SCD  Lab Results  Component Value Date   PLT 141* 09/19/2015    Antibiotics  :    Anti-infectives    Start     Dose/Rate Route Frequency Ordered Stop   09/18/15 1600  ceFAZolin (ANCEF) IVPB 2g/100 mL premix     2 g 200 mL/hr  over 30 Minutes Intravenous Every 6 hours 09/18/15 1341 09/19/15 0500   09/18/15 0900  ceFAZolin (ANCEF) IVPB 2g/100 mL premix     2 g 200 mL/hr over 30 Minutes Intravenous To ShortStay  Surgical 09/17/15 1958 09/18/15 1047        Objective:   Filed Vitals:   09/18/15 2025 09/19/15 0031 09/19/15 0511 09/19/15 1140  BP: 111/85 117/56 105/58 106/49  Pulse: 96 94 99   Temp: 97.4 F (36.3 C) 98.3 F (36.8 C) 98.7 F (37.1 C) 98.7 F (37.1 C)  TempSrc:  Axillary Axillary Oral  Resp:      Weight:      SpO2: 100% 100% 98% 100%    Wt Readings from Last 3 Encounters:  09/17/15 49.896 kg (110 lb)  09/17/15 49.896 kg (110 lb)  11/15/14 49.896 kg (110 lb)     Intake/Output Summary (Last 24 hours) at 09/19/15 1216 Last data filed at 09/19/15 0745  Gross per 24 hour  Intake    560 ml  Output    375 ml  Net    185 ml     Physical Exam  Awake Alert,Confused Supple Neck,No JVD, Symmetrical Chest wall movement, Good air movement bilaterally,  RRR,No Gallops,Rubs or new Murmurs, No Parasternal Heave +ve B.Sounds, Abd Soft, No tenderness. No Cyanosis, Clubbing or edema, No new Rash or bruise      Data Review:    CBC  Recent Labs Lab 09/17/15 1404 09/18/15 0600 09/19/15 0546  WBC 12.0* 9.1 9.8  HGB 14.7 13.4 8.1*  HCT 44.4 41.1 24.5*  PLT 149* 140* 141*  MCV 100.9* 99.8 98.0  MCH 33.4 32.5 32.4  MCHC 33.1 32.6 33.1  RDW 13.3 13.3 13.6  LYMPHSABS 0.9  --   --   MONOABS 0.7  --   --   EOSABS 0.0  --   --   BASOSABS 0.0  --   --     Chemistries   Recent Labs Lab 09/17/15 1404 09/18/15 0600 09/19/15 0546  NA 139 139 136  K 4.2 3.7 4.4  CL 99* 101 101  CO2 30 30 24   GLUCOSE 140* 155* 152*  BUN 12 8 11   CREATININE 0.64 0.66 0.59  CALCIUM 9.0 8.9 8.6*   ------------------------------------------------------------------------------------------------------------------ No results for input(s): CHOL, HDL, LDLCALC, TRIG, CHOLHDL, LDLDIRECT in the last 72  hours.  Lab Results  Component Value Date   HGBA1C 6.3* 07/16/2012   ------------------------------------------------------------------------------------------------------------------ No results for input(s): TSH, T4TOTAL, T3FREE, THYROIDAB in the last 72 hours.  Invalid input(s): FREET3 ------------------------------------------------------------------------------------------------------------------ No results for input(s): VITAMINB12, FOLATE, FERRITIN, TIBC, IRON, RETICCTPCT in the last 72 hours.  Coagulation profile No results for input(s): INR, PROTIME in the last 168 hours.  No results for input(s): DDIMER in the last 72 hours.  Cardiac Enzymes  Recent Labs Lab 09/17/15 1404  TROPONINI <0.03   ------------------------------------------------------------------------------------------------------------------ No results found for: BNP  Inpatient Medications  Scheduled Meds: . sodium chloride   Intravenous Once  . aspirin EC  81 mg Oral Daily  . divalproex  125 mg Oral BID  . docusate sodium  100 mg Oral BID  . donepezil  10 mg Oral Daily  . enoxaparin (LOVENOX) injection  30 mg Subcutaneous Q24H  . sertraline  75 mg Oral Daily   Continuous Infusions: . sodium chloride 125 mL/hr at 09/18/15 1600  . dextrose 5 % and 0.45% NaCl 50 mL/hr at 09/17/15 1838  . lactated ringers Stopped (09/18/15 1127)   PRN Meds:.acetaminophen **OR** acetaminophen, alum & mag hydroxide-simeth, clonazePAM, HYDROcodone-acetaminophen, menthol-cetylpyridinium **OR** phenol, methocarbamol **OR** methocarbamol (ROBAXIN)  IV, metoCLOPramide **OR** metoCLOPramide (REGLAN) injection, morphine injection, ondansetron **OR** ondansetron (ZOFRAN) IV, oxyCODONE, polyethylene glycol  Micro Results Recent Results (from  the past 240 hour(s))  MRSA PCR Screening     Status: None   Collection Time: 09/17/15  6:20 PM  Result Value Ref Range Status   MRSA by PCR NEGATIVE NEGATIVE Final    Comment:         The GeneXpert MRSA Assay (FDA approved for NASAL specimens only), is one component of a comprehensive MRSA colonization surveillance program. It is not intended to diagnose MRSA infection nor to guide or monitor treatment for MRSA infections.     Radiology Reports Ct Hip Right Wo Contrast  09/17/2015  CLINICAL DATA:  Fall at nursing home, right hip pain. EXAM: CT OF THE RIGHT HIP WITHOUT CONTRAST TECHNIQUE: Multidetector CT imaging of the right hip was performed according to the standard protocol. Multiplanar CT image reconstructions were also generated. COMPARISON:  Radiographs from 09/17/2015 FINDINGS: Nondisplaced intertrochanteric fracture. There is a mildly displaced greater trochanteric fragment but the extension over to the lesser trochanter is subtle. Visualized right hemipelvis unremarkable. Mild spurring of the acetabulum and femoral head. Vascular calcifications noted. Direct inguinal hernia contains adipose tissue. Abnormal subcutaneous bruising adjacent to the greater trochanter. IMPRESSION: 1. Nondisplaced intertrochanteric fracture, also including a mildly displaced transverse greater trochanteric fragment. 2. Direct right inguinal hernia contains adipose tissue. 3. Abnormal subcutaneous hematoma/bruising adjacent to the greater trochanter. Electronically Signed   By: Van Clines M.D.   On: 09/17/2015 13:39   Dg Chest Port 1 View  09/17/2015  CLINICAL DATA:  Jerelyn Scott twice today. EXAM: PORTABLE CHEST 1 VIEW COMPARISON:  04/05/2015 and 01/17/2014 FINDINGS: Lungs are adequately inflated without focal consolidation or effusion. Cardiomediastinal silhouette is within normal. There is calcified plaque over the thoracic aorta. Old bilateral rib fractures and possible acute to subacute posterior lateral right ninth rib fracture. Degenerative change of the spine. IMPRESSION: No acute cardiopulmonary disease. Old bilateral rib fractures. Acute to subacute right posterior lateral ninth  rib fracture. Aortic atherosclerosis. Electronically Signed   By: Marin Olp M.D.   On: 09/17/2015 14:15   Dg Knee Complete 4 Views Right  09/17/2015  CLINICAL DATA:  Status post fall this morning with a right knee injury. Pain. Initial encounter. EXAM: RIGHT KNEE - COMPLETE 4+ VIEW COMPARISON:  None. FINDINGS: No evidence of fracture, dislocation, or joint effusion. No evidence of arthropathy or other focal bone abnormality. Atherosclerotic vascular disease noted. IMPRESSION: No acute abnormality. Atherosclerosis. Electronically Signed   By: Inge Rise M.D.   On: 09/17/2015 10:05   Dg Hip Operative Unilat W Or W/o Pelvis Right  09/18/2015  CLINICAL DATA:  IM nail to right femur EXAM: OPERATIVE RIGHT HIP (WITH PELVIS IF PERFORMED) 2 VIEWS TECHNIQUE: Fluoroscopic spot image(s) were submitted for interpretation post-operatively. COMPARISON:  09/17/2015 FINDINGS: Internal fixation across the right intertrochanteric fracture. Anatomic alignment. No hardware bony complicating feature. IMPRESSION: Internal fixation.  No visible complicating feature. Electronically Signed   By: Rolm Baptise M.D.   On: 09/18/2015 11:24   Dg Hip Unilat With Pelvis 2-3 Views Right  09/17/2015  CLINICAL DATA:  Status post fall this morning.  Right hip pain. EXAM: DG HIP (WITH OR WITHOUT PELVIS) 2-3V RIGHT COMPARISON:  None. FINDINGS: There is no evidence of hip fracture or dislocation. Mild bilateral hip degenerative disease is seen. Atherosclerosis is noted. IMPRESSION: No acute abnormality. Mild bilateral hip degenerative change. Electronically Signed   By: Inge Rise M.D.   On: 09/17/2015 08:39    Time Spent in minutes  25 minutes   ELGERGAWY, DAWOOD M.D on 09/19/2015  at 12:16 PM  Between 7am to 7pm - Pager - [phone]  After 7pm go to [email or web address] - password [facility]  [facility] -  Office  [phone]

## 2015-09-19 NOTE — Progress Notes (Signed)
Physical Therapy Evaluation Patient Details Name: NAYOMIE ELTRINGHAM MRN: [id number] DOB: December 27, 1924 Today's Date: 09/19/2015   History of Present Illness  82 yo Female post fall and R hip fracture, with hx of dementia, stroke, lives at SNF. Tx of intertrochanteric fx with intramedullary implant.  Clinical Impression  Patient with dementia, confused on arrival, agreeable to direction of therapist.  MAX assist for bed mobility and transfer to bedside chair, limited by Right LE pain with movement and weight bearing.  Unable to ambulate on evaluation, unknown prior functional level, resided in SNF.  Patient will benefit from PT services, may return to SNF when medically appropriate.    Follow Up Recommendations SNF    Equipment Recommendations  None recommended by PT    Recommendations for Other Services       Precautions / Restrictions Precautions Precautions: Fall Precaution Comments: Dementia, confusedd Restrictions Weight Bearing Restrictions: Yes RLE Weight Bearing: Weight bearing as tolerated      Mobility  Bed Mobility Overal bed mobility: Needs Assistance Bed Mobility: Rolling;Supine to Sit;Sit to Supine Rolling: Min assist;Mod assist   Supine to sit: Max assist Sit to supine: Max assist      Transfers Overall transfer level: Needs assistance Equipment used: 1 person hand held assist Transfers: Sit to/from [facility] Sit to Stand: Max assist Stand pivot transfers: Total assist       General transfer comment: Limit by pain, patient held on to therapist, moved feet for pivot, unable to bear weight on Right LE.  Ambulation/Gait                Stairs            Wheelchair Mobility    Modified Rankin (Stroke Patients Only)       Balance Overall balance assessment: Needs assistance   Sitting balance-Leahy Scale: Fair       Standing balance-Leahy Scale: Zero                               Pertinent  Vitals/Pain Pain Assessment: Faces Faces Pain Scale: Hurts even more Pain Location: Right Leg Pain Descriptors / Indicators: Grimacing;Guarding Pain Intervention(s): Limited activity within patient's tolerance    Home Living Family/patient expects to be discharged to:: Skilled nursing facility                 Additional Comments: Unknown prior functional level, lives in SNF    Prior Function Level of Independence: Needs assistance         Comments: Unknown prior functional level, resides in SNF with dementia     Hand Dominance        Extremity/Trunk Assessment   Upper Extremity Assessment: Overall WFL for tasks assessed           Lower Extremity Assessment: RLE deficits/detail RLE Deficits / Details: Pain and guarding with movement       Communication   Communication: Expressive difficulties;Receptive difficulties  Cognition Arousal/Alertness: Awake/alert Behavior During Therapy: WFL for tasks assessed/performed Overall Cognitive Status: No family/caregiver present to determine baseline cognitive functioning                      General Comments      Exercises General Exercises - Lower Extremity Heel Slides: AAROM;Both;10 reps;Supine Hip ABduction/ADduction: AAROM;Both;10 reps;Supine Straight Leg Raises: AAROM;Both;10 reps;Supine      Assessment/Plan    PT Assessment Patient needs continued PT services  PT Diagnosis Difficulty walking;Generalized weakness;Acute pain   PT Problem List Decreased strength;Decreased activity tolerance;Decreased range of motion;Decreased mobility;Decreased cognition;Decreased safety awareness;Pain  PT Treatment Interventions Gait training;Functional mobility training;Therapeutic activities;Therapeutic exercise   PT Goals (Current goals can be found in the Care Plan section) Acute Rehab PT Goals Patient Stated Goal: Where are my scissors? PT Goal Formulation: Patient unable to participate in goal setting Time  For Goal Achievement: 10/03/15 Potential to Achieve Goals: Fair    Frequency Min 3X/week   Barriers to discharge        Co-evaluation               End of Session Equipment Utilized During Treatment: Gait belt Activity Tolerance: Patient limited by pain (and by dementia) Patient left: in bed;with call bell/phone within reach;with bed alarm set;with SCD's reapplied Nurse Communication: Mobility status         Time: 1540-1600 PT Time Calculation (min) (ACUTE ONLY): 20 min   Charges:   PT Evaluation $PT Eval Low Complexity: 1 Procedure     PT G CodesZenia Resides, Glendell Fouse L October 04, 2015, 4:33 PM

## 2015-09-20 LAB — BASIC METABOLIC PANEL
Anion gap: 9 (ref 5–15)
BUN: 11 mg/dL (ref 6–20)
CHLORIDE: 106 mmol/L (ref 101–111)
CO2: 25 mmol/L (ref 22–32)
CREATININE: 0.58 mg/dL (ref 0.44–1.00)
Calcium: 8.5 mg/dL — ABNORMAL LOW (ref 8.9–10.3)
GFR calc non Af Amer: >60 mL/min (ref >60)
Glucose, Bld: 147 mg/dL — ABNORMAL HIGH (ref 65–99)
POTASSIUM: 3.9 mmol/L (ref 3.5–5.1)
Sodium: 140 mmol/L (ref 135–145)

## 2015-09-20 LAB — TYPE AND SCREEN
ABO/RH(D): O POS
Antibody Screen: NEGATIVE
UNIT DIVISION: 0

## 2015-09-20 LAB — CBC
HEMATOCRIT: 29.2 % — AB (ref 36.0–46.0)
HEMOGLOBIN: 9.5 g/dL — AB (ref 12.0–15.0)
MCH: 30.6 pg (ref 26.0–34.0)
MCHC: 32.5 g/dL (ref 30.0–36.0)
MCV: 94.2 fL (ref 78.0–100.0)
Platelets: 111 10*3/uL — ABNORMAL LOW (ref 150–400)
RBC: 3.1 MIL/uL — ABNORMAL LOW (ref 3.87–5.11)
RDW: 16.7 % — ABNORMAL HIGH (ref 11.5–15.5)
WBC: 8.4 10*3/uL (ref 4.0–10.5)

## 2015-09-20 MED ORDER — DOCUSATE SODIUM 100 MG PO CAPS: 100.0000 mg | ORAL_CAPSULE | Freq: Two times a day (BID) | ORAL | Status: AC

## 2015-09-20 MED ORDER — ENOXAPARIN SODIUM 30 MG/0.3ML ~~LOC~~ SOLN: 30.0000 mg | SUBCUTANEOUS | Status: AC

## 2015-09-20 MED ORDER — ACETAMINOPHEN 325 MG PO TABS: 650.0000 mg | ORAL_TABLET | Freq: Four times a day (QID) | ORAL | Status: AC | PRN

## 2015-09-20 MED ORDER — CLONAZEPAM 0.5 MG PO TABS: 0.5000 mg | ORAL_TABLET | Freq: Two times a day (BID) | ORAL | Status: AC | PRN

## 2015-09-20 NOTE — Progress Notes (Signed)
CM notes pt to go to SNF; CSW aware and arranging; no other CM needs were communicated.

## 2015-09-20 NOTE — Clinical Social Work Note (Signed)
Clinical Social Work Assessment  Patient Details  Name: Christina Summers MRN: [id number] Date of Birth: [birth year]  Date of referral:  09/19/15               Reason for consult:  Discharge Planning                Permission sought to share information with:  Case Manager, Facility Sport and exercise psychologist, Family Supports Permission granted to share information::  Yes, Verbal Permission Granted, No  Name::        Agency::   (SNF)  Relationship::     Contact Information:     Housing/Transportation Living arrangements for the past 2 months:  Single Family Home Source of Information:  Power of Attorney, Adult Children, Medical Team Patient Interpreter Needed:    Criminal Activity/Legal Involvement Pertinent to Current Situation/Hospitalization:  No - Comment as needed Significant Relationships:  Adult Children Lives with:  Adult Children Do you feel safe going back to the place where you live?  No Need for family participation in patient care:  Yes (Comment)  Care giving concerns: Pt confused and unable to participate in assessment.  Pt son and daughter in law expressed concerns about mother attempting to walk around on her own. Pt family open to SNF for higher level of care.    Social Worker assessment / plan: Holiday representative spoke with Pt daughter in law via phone and discussed CSW role with discharge planning. Pt daughter in law indicated that she and her husband are the pt's POA. Pt daughter in law also requested CSW speak with her husband regarding discharge planning and to choose SNF placement for rehab. CSW spoke with pt son regarding PT SNF recommendation. Pt son requested [facility] [place] for SNF.   CSW will follow up with bed offers once available.   Employment status:  Retired Forensic scientist:  Medicare PT Recommendations:  [place] / Referral to community resources:  [place]  Patient/Family's  Response to care: Pt confused and unable to participate in assessment. Pt family understands SNF recommendation and appears happy with care pt is receiving at [facility].   Patient/Family's Understanding of and Emotional Response to Diagnosis, Current Treatment, and Prognosis: Pt family has a good understanding of reason for pt admission into hospital and pt care plan.   Emotional Assessment Appearance:   (Unable to access) Attitude/Demeanor/Rapport:  Unable to Assess Affect (typically observed):  Unable to Assess Orientation:  Oriented to Self, Oriented to Place, Oriented to  Time, Oriented to Situation Alcohol / Substance use:  Not Applicable Psych involvement (Current and /or in the community):  No (Comment)  Discharge Needs  Concerns to be addressed:  Discharge Planning Concerns Readmission within the last 30 days:  No Current discharge risk:  None Barriers to Discharge:  Continued Medical Work up   [employer], LCSW 09/20/2015, 8:41 AM

## 2015-09-20 NOTE — Discharge Summary (Signed)
Christina Summers, is a 82 y.o. female  DOB Jan 22, 1925  MRN [id number].  Admission date:  09/17/2015  Admitting Physician  Waldemar Dickens, MD  Discharge Date:  09/20/2015   Primary MD   Melinda Crutch, MD  Recommendations for primary care physician for things to follow:  - Please take CBC, BMP in 3 days - Patient to follow with orthopedic in 2 weeks  CODE STATUS DO NOT RESUSCITATE Admission Diagnosis  Intertrochanteric fracture of right femur, closed, initial encounter [facility]) [S72.141A]   Discharge Diagnosis  Intertrochanteric fracture of right femur, closed, initial encounter ([place]) [S72.141A]    Active Problems:   Dementia with behavioral disturbance   CVA (cerebral infarction)   Fracture, intertrochanteric, right femur [facility])      Past Medical History  Diagnosis Date  . Insomnia, unspecified   . Hard of hearing 06/25/2012  . Stroke ([place])     tia  . Cancer ([place])     skin  . Dementia   . Dementia with behavioral disturbance 06/25/2012  . Anxiety     Past Surgical History  Procedure Laterality Date  . No past surgeries    . Excision melanoma with sentinel lymph node biopsy Left 10/07/2013    Procedure:  LEFT UPPER EXTREMITY EXCISIONAL BIOPSY;  Surgeon: Ralene Ok, MD;  Location: [facility];  Service: General;  Laterality: Left;       History of present illness and  Hospital Course:     Kindly see H&P for history of present illness and admission details, please review complete Labs, Consult reports and Test reports for all details in brief  HPI  from the history and physical done on the day of admission 09/17/2015 Christina Summers is a 82 y.o. female, A shunt-year-old female with past medical history of stroke, dementia, who lives at skilled nursing facility, patient with advanced dementia, was combative in ED, where she received 2 mg of IV Ativan, currently she is lethargic, can't provide  any complaints, try to contact family members on records, with no answer, so history was obtained mainly from medical records and previous ED physician documentation. Patient presents with fall, it was earlier at [facility] with a fall, was able to ambulate, discharged back to facility, unfortunately she did sustained another unwitnessed fall, and her CT hip was significant for right intertrochanteric hip fracture, workup in ED significant for mild leukocytosis at 12, she is afebrile, negative troponin, EKG nonacute.   Hospital Course  82 year old female with history of dementia, stroke,lives at SNF, presents with fall, sustained right hip fracture, status post surgical repair by Dr. Erlinda Hong on 6/23  Right intertrochanteric femur fracture - due to unwitnessed fall at SNF - Status post surgical repair by Dr.Xu on 6/23 -  SNF placement, to follow with orthopedic in 2 weeks from discharge, continue with Lovenox for DVT prophylaxis for 30 days and stop  History of CVA - Continue with aspirin  dementia with behavioral disturbances - continue with home medication   Postoperative anemia -  transfused 1  unit PRBC 6/24, glycohemoglobin is 9.5 on discharge   Discharge Condition:  stable   Follow UP  Follow-up Information    Follow up with Marianna Payment, MD In 2 weeks.   Specialty:  Orthopedic Surgery   Why:  For suture removal, For wound re-check   Contact information:   [place] [id number] [phone]       Follow up with  Melinda Crutch, MD. Schedule an appointment as soon as possible for a visit in 1 week.   Specialty:  Family Medicine   Contact information:   [place] [address] [phone]         Discharge Instructions  and  Discharge Medications     Discharge Instructions    Discharge instructions    Complete by:  As directed   Follow with Primary MD  Melinda Crutch, MD in 7 days   Get CBC, CMP, checked  by Primary MD next  visit.    Activity: Weight bearing As tolerated with Full fall precautions use walker/cane & assistance as needed   Disposition SNF   Diet: Regular diet with thin liquid, with feeding assistance and aspiration precautions.  For Heart failure patients - Check your Weight same time everyday, if you gain over 2 pounds, or you develop in leg swelling, experience more shortness of breath or chest pain, call your Primary MD immediately. Follow Cardiac Low Salt Diet and 1.5 lit/day fluid restriction.   On your next visit with your primary care physician please Get Medicines reviewed and adjusted.   Please request your Prim.MD to go over all Hospital Tests and Procedure/Radiological results at the follow up, please get all Hospital records sent to your Prim MD by signing hospital release before you go home.   If you experience worsening of your admission symptoms, develop shortness of breath, life threatening emergency, suicidal or homicidal thoughts you must seek medical attention immediately by calling 911 or calling your MD immediately  if symptoms less severe.  You Must read complete instructions/literature along with all the possible adverse reactions/side effects for all the Medicines you take and that have been prescribed to you. Take any new Medicines after you have completely understood and accpet all the possible adverse reactions/side effects.   Do not drive, operating heavy machinery, perform activities at heights, swimming or participation in water activities or provide baby sitting services if your were admitted for syncope or siezures until you have seen by Primary MD or a Neurologist and advised to do so again.  Do not drive when taking Pain medications.    Do not take more than prescribed Pain, Sleep and Anxiety Medications  Special Instructions: If you have smoked or chewed Tobacco  in the last 2 yrs please stop smoking, stop any regular Alcohol  and or any Recreational drug  use.  Wear Seat belts while driving.   Please note  You were cared for by a hospitalist during your hospital stay. If you have any questions about your discharge medications or the care you received while you were in the hospital after you are discharged, you can call the unit and asked to speak with the hospitalist on call if the hospitalist that took care of you is not available. Once you are discharged, your primary care physician will handle any further medical issues. Please note that NO REFILLS for any discharge medications will be authorized once you are discharged, as it is imperative that you return to  your primary care physician (or establish a relationship with a primary care physician if you do not have one) for your aftercare needs so that they can reassess your need for medications and monitor your lab values.     Weight bearing as tolerated    Complete by:  As directed             Medication List    STOP taking these medications        neomycin-bacitracin-polymyxin [phone] ointment      TAKE these medications        acetaminophen 325 MG tablet  Commonly known as:  TYLENOL  Take 2 tablets (650 mg total) by mouth every 6 (six) hours as needed for mild pain (or Fever >/= 101).     aspirin 81 MG tablet  Take 81 mg by mouth daily.     clonazePAM 0.5 MG tablet  Commonly known as:  KLONOPIN  Take 1 tablet (0.5 mg total) by mouth 2 (two) times daily as needed for anxiety.     divalproex 125 MG capsule  Commonly known as:  DEPAKOTE SPRINKLE  Take 125 mg by mouth 2 (two) times daily.     docusate sodium 100 MG capsule  Commonly known as:  COLACE  Take 1 capsule (100 mg total) by mouth 2 (two) times daily.     donepezil 10 MG tablet  Commonly known as:  ARICEPT  Take 1 tablet (10 mg total) by mouth daily.     enoxaparin 30 MG/0.3ML injection  Commonly known as:  LOVENOX  Inject 0.3 mLs (30 mg total) into the skin daily. Please take for 30 days then stop.      HYDROcodone-acetaminophen 7.5-325 MG tablet  Commonly known as:  NORCO  Take 1-2 tablets by mouth every 6 (six) hours as needed for moderate pain.     sertraline 50 MG tablet  Commonly known as:  ZOLOFT  Take 75 mg by mouth daily.     Vitamin D3 1000 units Caps  Take by mouth daily.          Diet and Activity recommendation: See Discharge Instructions above   Consults obtained -  Orthopedic Dr. Erlinda Hong   Major procedures and Radiology Reports - PLEASE review detailed and final reports for all details, in brief -   Treatment of intertrochanteric fracture with intramedullary implant by Dr Erlinda Hong on 6/23 1 unit PRBC transfusion on 6/24  Ct Hip Right Wo Contrast  09/17/2015  CLINICAL DATA:  Fall at nursing home, right hip pain. EXAM: CT OF THE RIGHT HIP WITHOUT CONTRAST TECHNIQUE: Multidetector CT imaging of the right hip was performed according to the standard protocol. Multiplanar CT image reconstructions were also generated. COMPARISON:  Radiographs from 09/17/2015 FINDINGS: Nondisplaced intertrochanteric fracture. There is a mildly displaced greater trochanteric fragment but the extension over to the lesser trochanter is subtle. Visualized right hemipelvis unremarkable. Mild spurring of the acetabulum and femoral head. Vascular calcifications noted. Direct inguinal hernia contains adipose tissue. Abnormal subcutaneous bruising adjacent to the greater trochanter. IMPRESSION: 1. Nondisplaced intertrochanteric fracture, also including a mildly displaced transverse greater trochanteric fragment. 2. Direct right inguinal hernia contains adipose tissue. 3. Abnormal subcutaneous hematoma/bruising adjacent to the greater trochanter. Electronically Signed   By: Van Clines M.D.   On: 09/17/2015 13:39   Dg Chest Port 1 View  09/17/2015  CLINICAL DATA:  Jerelyn Scott twice today. EXAM: PORTABLE CHEST 1 VIEW COMPARISON:  04/05/2015 and 01/17/2014 FINDINGS: Lungs are adequately inflated without focal  consolidation or effusion. Cardiomediastinal silhouette is within normal. There is calcified plaque over the thoracic aorta. Old bilateral rib fractures and possible acute to subacute posterior lateral right ninth rib fracture. Degenerative change of the spine. IMPRESSION: No acute cardiopulmonary disease. Old bilateral rib fractures. Acute to subacute right posterior lateral ninth rib fracture. Aortic atherosclerosis. Electronically Signed   By: Marin Olp M.D.   On: 09/17/2015 14:15   Dg Knee Complete 4 Views Right  09/17/2015  CLINICAL DATA:  Status post fall this morning with a right knee injury. Pain. Initial encounter. EXAM: RIGHT KNEE - COMPLETE 4+ VIEW COMPARISON:  None. FINDINGS: No evidence of fracture, dislocation, or joint effusion. No evidence of arthropathy or other focal bone abnormality. Atherosclerotic vascular disease noted. IMPRESSION: No acute abnormality. Atherosclerosis. Electronically Signed   By: Inge Rise M.D.   On: 09/17/2015 10:05   Dg Hip Operative Unilat W Or W/o Pelvis Right  09/18/2015  CLINICAL DATA:  IM nail to right femur EXAM: OPERATIVE RIGHT HIP (WITH PELVIS IF PERFORMED) 2 VIEWS TECHNIQUE: Fluoroscopic spot image(s) were submitted for interpretation post-operatively. COMPARISON:  09/17/2015 FINDINGS: Internal fixation across the right intertrochanteric fracture. Anatomic alignment. No hardware bony complicating feature. IMPRESSION: Internal fixation.  No visible complicating feature. Electronically Signed   By: Rolm Baptise M.D.   On: 09/18/2015 11:24   Dg Hip Unilat With Pelvis 2-3 Views Right  09/17/2015  CLINICAL DATA:  Status post fall this morning.  Right hip pain. EXAM: DG HIP (WITH OR WITHOUT PELVIS) 2-3V RIGHT COMPARISON:  None. FINDINGS: There is no evidence of hip fracture or dislocation. Mild bilateral hip degenerative disease is seen. Atherosclerosis is noted. IMPRESSION: No acute abnormality. Mild bilateral hip degenerative change. Electronically  Signed   By: Inge Rise M.D.   On: 09/17/2015 08:39    Micro Results    Recent Results (from the past 240 hour(s))  MRSA PCR Screening     Status: None   Collection Time: 09/17/15  6:20 PM  Result Value Ref Range Status   MRSA by PCR NEGATIVE NEGATIVE Final    Comment:        The GeneXpert MRSA Assay (FDA approved for NASAL specimens only), is one component of a comprehensive MRSA colonization surveillance program. It is not intended to diagnose MRSA infection nor to guide or monitor treatment for MRSA infections.        Today   Subjective:   Amyia Duecker With significant dementia, cannot provide any complaints  Objective:   Blood pressure 126/68, pulse 82, temperature 98 F (36.7 C), temperature source Oral, resp. rate 16, weight 49.896 kg (110 lb), SpO2 92 %.   Intake/Output Summary (Last 24 hours) at 09/20/15 1252 Last data filed at 09/19/15 2300  Gross per 24 hour  Intake    630 ml  Output      0 ml  Net    630 ml    Exam Awake ,Confused Supple Neck,No JVD, Symmetrical Chest wall movement, Good air movement bilaterally,  RRR,No Gallops,Rubs or new Murmurs, No Parasternal Heave +ve B.Sounds, Abd Soft, No tenderness. No Cyanosis, Clubbing or edema, No new Rash or bruise   Data Review   CBC w Diff: Lab Results  Component Value Date   WBC 8.4 09/20/2015   HGB 9.5* 09/20/2015   HCT 29.2* 09/20/2015   PLT 111* 09/20/2015   LYMPHOPCT 8 09/17/2015   MONOPCT 6 09/17/2015   EOSPCT 0 09/17/2015   BASOPCT 0 09/17/2015    CMP:  Lab Results  Component Value Date   NA 140 09/20/2015   K 3.9 09/20/2015   CL 106 09/20/2015   CO2 25 09/20/2015   BUN 11 09/20/2015   CREATININE 0.58 09/20/2015   PROT 6.5 01/17/2014   ALBUMIN 3.1* 01/17/2014   BILITOT <0.2* 01/17/2014   ALKPHOS 101 01/17/2014   AST 18 01/17/2014   ALT 9 01/17/2014  .   Total Time in preparing paper work, data evaluation and todays exam - 35 minutes  Jakirah Zaun M.D  on 09/20/2015 at 12:52 PM  [facility]   Office  [phone]

## 2015-09-20 NOTE — Clinical Social Work Placement (Signed)
   CLINICAL SOCIAL WORK PLACEMENT  NOTE  Date:  09/20/2015  Patient Details  Name: Christina Summers MRN: [id number] Date of Birth: 07/18/1924  Clinical Social Work is seeking post-discharge placement for this patient at the [place] level of care (*CSW will initial, date and re-position this form in  chart as items are completed):  Yes   Patient/family provided with [place] Work Department's list of facilities offering this level of care within the geographic area requested by the patient (or if unable, by the patient's family).  Yes   Patient/family informed of their freedom to choose among providers that offer the needed level of care, that participate in Medicare, Medicaid or managed care program needed by the patient, have an available bed and are willing to accept the patient.  Yes   Patient/family informed of [facility]'s ownership interest in [facility] and [facility], as well as of the fact that they are under no obligation to receive care at these facilities.  PASRR submitted to EDS on 09/20/15     PASRR number received on 09/20/15     Existing PASRR number confirmed on       FL2 transmitted to all facilities in geographic area requested by pt/family on 09/20/15     FL2 transmitted to all facilities within larger geographic area on       Patient informed that his/her managed care company has contracts with or will negotiate with certain facilities, including the following:            Patient/family informed of bed offers received.  Patient chooses bed at       Physician recommends and patient chooses bed at      Patient to be transferred to   on  .  Patient to be transferred to facility by       Patient family notified on   of transfer.  Name of family member notified:        PHYSICIAN Please prepare priority discharge summary, including medications, Please prepare prescriptions, Please sign FL2, Please sign DNR      Additional Comment:    _______________________________________________ Junie Spencer, LCSW 09/20/2015, 9:27 AM

## 2015-09-20 NOTE — Progress Notes (Signed)
Report called to Jenny Reichmann, Therapist, sports at [facility] facility.  IV x 2 removed.  Patient changed prior to discharge due to bowel and bladder incontinence.  No complaints at time of discharge.  Still oriented to person only.

## 2015-09-20 NOTE — NC FL2 (Signed)
[place] MEDICAID FL2 LEVEL OF CARE SCREENING TOOL     IDENTIFICATION  Patient Name: Christina Summers Birthdate: December 27, 1924 Sex: female Admission Date (Current Location): 09/17/2015  [facility] and [place] Number:  Herbalist and Address:  [facility] [place] [address], [place], [place] [address]      Provider Number: [id number]  Attending Physician Name and Address:  Albertine Patricia, MD  Relative Name and Phone Number:       Current Level of Care: Hospital Recommended Level of Care: [place] Prior Approval Number:    Date Approved/Denied:   PASRR Number:  ([id number])  Discharge Plan: SNF    Current Diagnoses: Patient Active Problem List   Diagnosis Date Noted  . Fracture, intertrochanteric, right femur ([place]) 09/17/2015  . Melanoma of left upper arm ([place]) 09/26/2013  . Other and unspecified hyperlipidemia 07/16/2012  . CVA (cerebral infarction) 07/15/2012  . Dementia 07/15/2012  . Insomnia, unspecified 06/25/2012  . Dementia with behavioral disturbance 06/25/2012  . Hard of hearing 06/25/2012    Orientation RESPIRATION BLADDER Height & Weight     Self  O2, Normal Incontinent Weight: 110 lb (49.896 kg) Height:     BEHAVIORAL SYMPTOMS/MOOD NEUROLOGICAL BOWEL NUTRITION STATUS   (None)  (None) Continent Diet (Regular)  AMBULATORY STATUS COMMUNICATION OF NEEDS Skin   Extensive Assist Verbally Surgical wounds                       Personal Care Assistance Level of Assistance  Bathing, Feeding, Dressing Bathing Assistance: Limited assistance Feeding assistance: Independent Dressing Assistance: Limited assistance     Functional Limitations Info  Sight, Hearing, Speech Sight Info: Adequate Hearing Info: Impaired Speech Info: Adequate    SPECIAL CARE FACTORS FREQUENCY  PT (By licensed PT)     PT Frequency:  (5x/week)              Contractures      Additional Factors Info  Code Status,  Allergies Code Status Info:  (DNR) Allergies Info:  (NKDA)           Current Medications (09/20/2015):  This is the current hospital active medication list Current Facility-Administered Medications  Medication Dose Route Frequency Provider Last Rate Last Dose  . 0.9 %  sodium chloride infusion   Intravenous Continuous Leandrew Koyanagi, MD 125 mL/hr at 09/18/15 1600    . 0.9 %  sodium chloride infusion   Intravenous Once Albertine Patricia, MD      . acetaminophen (TYLENOL) tablet 650 mg  650 mg Oral Q6H PRN Leandrew Koyanagi, MD   650 mg at 09/19/15 2123   Or  . acetaminophen (TYLENOL) suppository 650 mg  650 mg Rectal Q6H PRN Leandrew Koyanagi, MD      . alum & mag hydroxide-simeth (MAALOX/MYLANTA) 200-200-20 MG/5ML suspension 30 mL  30 mL Oral Q4H PRN Naiping Ephriam Jenkins, MD      . aspirin EC tablet 81 mg  81 mg Oral Daily Albertine Patricia, MD   81 mg at 09/19/15 1046  . clonazePAM (KLONOPIN) tablet 0.25 mg  0.25 mg Oral BID PRN Albertine Patricia, MD   0.25 mg at 09/17/15 2334  . dextrose 5 %-0.45 % sodium chloride infusion   Intravenous Continuous Albertine Patricia, MD 50 mL/hr at 09/17/15 1838    . divalproex (DEPAKOTE SPRINKLE) capsule 125 mg  125 mg Oral BID Albertine Patricia, MD   125  mg at 09/19/15 2123  . docusate sodium (COLACE) capsule 100 mg  100 mg Oral BID Albertine Patricia, MD   100 mg at 09/19/15 2123  . donepezil (ARICEPT) tablet 10 mg  10 mg Oral Daily Albertine Patricia, MD   10 mg at 09/19/15 1046  . enoxaparin (LOVENOX) injection 30 mg  30 mg Subcutaneous Q24H Albertine Patricia, MD   30 mg at 09/19/15 0755  . HYDROcodone-acetaminophen (NORCO/VICODIN) 5-325 MG per tablet 1-2 tablet  1-2 tablet Oral Q6H PRN Leandrew Koyanagi, MD   1 tablet at 09/19/15 1720  . lactated ringers infusion   Intravenous Continuous Roderic Palau, MD   Stopped at 09/18/15 1127  . menthol-cetylpyridinium (CEPACOL) lozenge 3 mg  1 lozenge Oral PRN Naiping Ephriam Jenkins, MD       Or  . phenol (CHLORASEPTIC) mouth spray 1  spray  1 spray Mouth/Throat PRN Naiping Ephriam Jenkins, MD      . methocarbamol (ROBAXIN) tablet 500 mg  500 mg Oral Q6H PRN Leandrew Koyanagi, MD   500 mg at 09/19/15 1823   Or  . methocarbamol (ROBAXIN) 500 mg in dextrose 5 % 50 mL IVPB  500 mg Intravenous Q6H PRN Naiping Ephriam Jenkins, MD      . metoCLOPramide (REGLAN) tablet 5-10 mg  5-10 mg Oral Q8H PRN Naiping Ephriam Jenkins, MD       Or  . metoCLOPramide (REGLAN) injection 5-10 mg  5-10 mg Intravenous Q8H PRN Naiping Ephriam Jenkins, MD      . morphine 2 MG/ML injection 0.5 mg  0.5 mg Intravenous Q2H PRN Naiping Ephriam Jenkins, MD      . ondansetron [facility]) tablet 4 mg  4 mg Oral Q6H PRN Naiping Ephriam Jenkins, MD       Or  . ondansetron [facility]) injection 4 mg  4 mg Intravenous Q6H PRN Naiping Ephriam Jenkins, MD      . oxyCODONE (Oxy IR/ROXICODONE) immediate release tablet 5-10 mg  5-10 mg Oral Q4H PRN Leandrew Koyanagi, MD   10 mg at 09/18/15 1752  . polyethylene glycol (MIRALAX / GLYCOLAX) packet 17 g  17 g Oral Daily PRN Albertine Patricia, MD      . sertraline (ZOLOFT) tablet 75 mg  75 mg Oral Daily Albertine Patricia, MD   75 mg at 09/19/15 1046     Discharge Medications: Please see discharge summary for a list of discharge medications.  Relevant Imaging Results:  Relevant Lab Results:   Additional Information SS#:[id number]  Junie Spencer, LCSW

## 2015-09-20 NOTE — Progress Notes (Signed)
Called to give report to [facility] staff x 5, last called at [phone].   Provided number to Roxanne to give to receiving RN.

## 2015-09-20 NOTE — Clinical Social Work Note (Signed)
CSW informed by doctor that pt ready to discharge today. CSW contacted Tammy at [facility] upon pt family request for pt to discharge to [facility] for SNF. Tammy confirmed that SNF can accept pt today. CSW spoke with pt son and notified that pt is discharging today. [facility] contacted to transport pt to [facility]. Nurse also informed of pt discharge and asked to provide SNF with orders.

## 2015-09-21 ENCOUNTER — Non-Acute Institutional Stay (SKILLED_NURSING_FACILITY): Payer: Medicare Other | Admitting: Adult Health

## 2015-09-21 ENCOUNTER — Encounter: Payer: Self-pay | Admitting: Adult Health

## 2015-09-21 DIAGNOSIS — F418 Other specified anxiety disorders: Secondary | ICD-10-CM | POA: Diagnosis not present

## 2015-09-21 DIAGNOSIS — S72141S Displaced intertrochanteric fracture of right femur, sequela: Secondary | ICD-10-CM | POA: Diagnosis not present

## 2015-09-21 DIAGNOSIS — I639 Cerebral infarction, unspecified: Secondary | ICD-10-CM | POA: Diagnosis not present

## 2015-09-21 DIAGNOSIS — K5901 Slow transit constipation: Secondary | ICD-10-CM

## 2015-09-21 DIAGNOSIS — F03918 Unspecified dementia, unspecified severity, with other behavioral disturbance: Secondary | ICD-10-CM

## 2015-09-21 DIAGNOSIS — F0391 Unspecified dementia with behavioral disturbance: Secondary | ICD-10-CM

## 2015-09-21 NOTE — Progress Notes (Signed)
Patient ID: Christina Summers, female   DOB: 03/23/1925, 82 y.o.   MRN: [id number]    Location:   [facility] Room Number: 151-A Place of Service:  SNF (31)   CODE STATUS: Full Code until otherwise determine  No Known Allergies  Chief Complaint  Patient presents with  . Hospitalization Follow-up    Hospital follow up    HPI:  She has been hospitalized for a right intertrochanteric fracture with surgical repair. At this time she is here for acute rehab. The goal of her care is for long term placement. Her previous living environment was assisted living. I am not certain that she will be able to return back to assisted living. She is unable to participate in the hpi or ros. There are no nursing concerns at this time.     Past Medical History  Diagnosis Date  . Insomnia, unspecified   . Hard of hearing 06/25/2012  . Stroke ([place])     tia  . Cancer ([place])     skin  . Dementia   . Dementia with behavioral disturbance 06/25/2012  . Anxiety     Past Surgical History  Procedure Laterality Date  . No past surgeries    . Excision melanoma with sentinel lymph node biopsy Left 10/07/2013    Procedure:  LEFT UPPER EXTREMITY EXCISIONAL BIOPSY;  Surgeon: Ralene Ok, MD;  Location: [facility];  Service: General;  Laterality: Left;    Social History   Social History  . Marital Status: Widowed    Spouse Name: N/A  . Number of Children: 1  . Years of Education: hs   Occupational History  . Not on file.   Social History Main Topics  . Smoking status: Never Smoker   . Smokeless tobacco: Never Used  . Alcohol Use: No  . Drug Use: No  . Sexual Activity: No   Other Topics Concern  . Not on file   Social History Narrative   Patient is widowed and lives with her granddaughter Jonelle Sidle).   Patient is retired.   Patient has one son.   Patient does not drink any caffeine.   Patient is right-handed.   Patient has a high school education.   Family  History  Problem Relation Age of Onset  . Other Mother   . Other Father   . Heart attack Father       VITAL SIGNS BP 112/68 mmHg  Pulse 93  Temp(Src) 97.9 F (36.6 C) (Oral)  Resp 18  Ht 4\' 11"  (1.499 m)  Wt 109 lb (49.442 kg)  BMI 22.00 kg/m2  SpO2 94%  Patient's Medications  New Prescriptions   No medications on file  Previous Medications   ACETAMINOPHEN (TYLENOL) 325 MG TABLET    Take 2 tablets (650 mg total) by mouth every 6 (six) hours as needed for mild pain (or Fever >/= 101).   ASPIRIN 81 MG TABLET    Take 81 mg by mouth daily.   CHOLECALCIFEROL (VITAMIN D3) 1000 UNITS CAPS    Take by mouth daily.   CLONAZEPAM (KLONOPIN) 0.5 MG TABLET    Take 1 tablet (0.5 mg total) by mouth 2 (two) times daily as needed for anxiety.   DIVALPROEX (DEPAKOTE SPRINKLE) 125 MG CAPSULE    Take 125 mg by mouth 2 (two) times daily.   DOCUSATE SODIUM (COLACE) 100 MG CAPSULE    Take 1 capsule (100 mg total) by mouth 2 (two) times daily.   DONEPEZIL (  ARICEPT) 10 MG TABLET    Take 1 tablet (10 mg total) by mouth daily.   ENOXAPARIN (LOVENOX) 30 MG/0.3ML INJECTION    Inject 0.3 mLs (30 mg total) into the skin daily. Please take for 30 days then stop.   HYDROCODONE-ACETAMINOPHEN (NORCO) 7.5-325 MG TABLET    Take 1-2 tablets by mouth every 6 (six) hours as needed for moderate pain.   SERTRALINE (ZOLOFT) 50 MG TABLET    Take 75 mg by mouth daily.  Modified Medications   No medications on file  Discontinued Medications   No medications on file     SIGNIFICANT DIAGNOSTIC EXAMS  09-17-15: chest x-ray: No acute cardiopulmonary disease. Old bilateral rib fractures. Acute to subacute right posterior lateral ninth rib fracture. Aortic atherosclerosis.  09-17-15: ct of right hip: 1. Nondisplaced intertrochanteric fracture, also including a mildly displaced transverse greater trochanteric fragment. 2. Direct right inguinal hernia contains adipose tissue. 3. Abnormal subcutaneous hematoma/bruising  adjacent to the greater trochanter.  09-17-15: right knee x-ray: No acute abnormality. Atherosclerosis.  LABS REVIEWED:   09-17-15: wbc 12.0; hb 14.7; hct 44.4; mcv 100.9; plt 149; glucose 140; bun 12; creat 0.64; k+ 4.2; na++ 139 09-19-15: wbc 9.8; hgb 8.1; hct 24.5; mcv 98.0; plt 141; glucose 152; bun 11; creat 0.58; k+ 3.9; na++ 140 09-20-15: wbc 8.4; hgb 9.5; hct 29.2; mcv 94.2; ptl 111; glucose 147; bun 11; creat 0.58; k+ 3.9; na++ 140      Review of Systems  Unable to perform ROS: dementia    Physical Exam  Constitutional: No distress.  Frail   Eyes: Conjunctivae are normal.  Neck: Neck supple. No JVD present. No thyromegaly present.  Cardiovascular: Normal rate, regular rhythm and intact distal pulses.   Respiratory: Effort normal and breath sounds normal. No respiratory distress. She has no wheezes.  GI: Soft. Bowel sounds are normal. She exhibits no distension. There is no tenderness.  Musculoskeletal: She exhibits edema.  Would not move lower extremities Has bilateral trace lower extremity edema Is wearing ted hose  Lymphadenopathy:    She has no cervical adenopathy.  Neurological: She is alert.  Skin: Skin is warm and dry. She is not diaphoretic.  Incision line without signs of infection present Skin is frail and bruised   Psychiatric: She has a normal mood and affect.      ASSESSMENT/ PLAN:  1. Dementia; is not verbal today; will continue aricept 10 mg daily at this time; may need to lower the dose or stop if she loses weight  2. Constipation: will continued colace twice daily   3. CVA: is without change neurologically; will continue asa 81 mg daily   4. Depression with anxiety and agitation: will continue zoloft 75 mg daily has klonopin 0.5 mg twice daily as needed and is taking depakote sprinkles 125 mg twice daily to stabilize mood  5. Right intertrochanteric fracture: will continue therapy as directed; will follow up with orthopedics; will continue lovenox  30 mg daily for 30 days; and will continue vicodin 7.5/325 mg 1 or 2 tabs every 6 hours as needed for pain    Will check cbc; cmp    Time spent with patient  50  minutes >50% time spent counseling; reviewing medical record; tests; labs; and developing future plan of care    Ok Edwards NP [facility]  Contact [phone] Monday through Friday 8am- 5pm  After hours call [phone]

## 2015-09-22 ENCOUNTER — Non-Acute Institutional Stay (SKILLED_NURSING_FACILITY): Payer: Medicare Other | Admitting: Internal Medicine

## 2015-09-22 ENCOUNTER — Encounter: Payer: Self-pay | Admitting: Internal Medicine

## 2015-09-22 DIAGNOSIS — D62 Acute posthemorrhagic anemia: Secondary | ICD-10-CM

## 2015-09-22 DIAGNOSIS — K5901 Slow transit constipation: Secondary | ICD-10-CM

## 2015-09-22 DIAGNOSIS — F418 Other specified anxiety disorders: Secondary | ICD-10-CM

## 2015-09-22 DIAGNOSIS — S72141S Displaced intertrochanteric fracture of right femur, sequela: Secondary | ICD-10-CM

## 2015-09-22 DIAGNOSIS — F03918 Unspecified dementia, unspecified severity, with other behavioral disturbance: Secondary | ICD-10-CM

## 2015-09-22 DIAGNOSIS — F0391 Unspecified dementia with behavioral disturbance: Secondary | ICD-10-CM | POA: Diagnosis not present

## 2015-09-22 DIAGNOSIS — Z8673 Personal history of transient ischemic attack (TIA), and cerebral infarction without residual deficits: Secondary | ICD-10-CM

## 2015-09-22 NOTE — Progress Notes (Signed)
HISTORY AND PHYSICAL   DATE: 09/22/15  Location:    [facility] Room Number: 309 A Place of Service: SNF (31)   Extended Emergency Contact Information Primary Emergency Contact: Goda,Sheila Address: [address]          [place], [place] [address] [place] Litter of [facility] Phone: [phone] Mobile Phone: [phone] Relation: Relative Secondary Emergency Contact: [place] Phone: [phone] Relation: Son  Advanced Directive information Does patient have an advance directive?: Yes, Type of Advance Directive: Out of facility DNR (pink MOST or yellow form), Does patient want to make changes to advanced directive?: No - Patient declined  Chief Complaint  Patient presents with  . New Admit To SNF    HPI:  82 yo female seen today as a new admission into SNF following hospital stay for right femur intertrochanteric closed fx s/p fall, acute blood loss anemia, dementia, hx CVA, anxiety d/o, hx melanoma s/p excision. She had an unwitnessed fall at [facility]. Xray did not reveal fx but she was unable to weightbear and CT rip hip showed intertrochanteric fx. She was taken to the OR 6/23rd for right intramedullary implant by Dr Erlinda Hong. She has post op anemia and was given 1 unit PRBCs. H/H 14.7/44.4-->8.1/24.5-->transfusion-->9.5/29.2. Cr 0.58 at d/c. She presents for short term rehab. She gets lovenox for DVT prophylaxis  She is nonverbal. No concerns. Pain appears to be controlled. No f/c. No falls. No nursing issues. She is a poor historian due to dementia. Hx obtained from chart.  Dementia - takes aricept 10 mg daily at this time; may need to lower the dose or stop if she loses weight  Constipation - stable on colace twice daily   Hx CVA- stable. Takes  asa 81 mg daily   depression with anxiety and agitation - mood stable on zoloft 75 mg daily; klonopin 0.5 mg twice daily as needed; depakote sprinkles 125 mg twice daily to  stabilize mood  Past Medical History  Diagnosis Date  . Insomnia, unspecified   . Hard of hearing 06/25/2012  . Stroke ([place])     tia  . Cancer ([place])     skin  . Dementia   . Dementia with behavioral disturbance 06/25/2012  . Anxiety     Past Surgical History  Procedure Laterality Date  . No past surgeries    . Excision melanoma with sentinel lymph node biopsy Left 10/07/2013    Procedure:  LEFT UPPER EXTREMITY EXCISIONAL BIOPSY;  Surgeon: Ralene Ok, MD;  Location: [facility];  Service: General;  Laterality: Left;  . Intramedullary (im) nail intertrochanteric Right 09/18/2015    Procedure: INTRAMEDULLARY (IM) NAIL INTERTROCHANTRIC FEMUR FRACTURE;  Surgeon: Leandrew Koyanagi, MD;  Location: [facility];  Service: Orthopedics;  Laterality: Right;    Patient Care Team: Lona Kettle, MD as PCP - General (Family Medicine)  Social History   Social History  . Marital Status: Widowed    Spouse Name: N/A  . Number of Children: 1  . Years of Education: hs   Occupational History  . Not on file.   Social History Main Topics  . Smoking status: Never Smoker   . Smokeless tobacco: Never Used  . Alcohol Use: No  . Drug Use: No  . Sexual Activity: No   Other Topics Concern  . Not on file   Social History Narrative   Patient is widowed and lives with her granddaughter Jonelle Sidle).   Patient is retired.  Patient has one son.   Patient does not drink any caffeine.   Patient is right-handed.   Patient has a high school education.     reports that she has never smoked. She has never used smokeless tobacco. She reports that she does not drink alcohol or use illicit drugs.  Family History  Problem Relation Age of Onset  . Other Mother   . Other Father   . Heart attack Father    Family Status  Relation Status Death Age  . Mother Deceased   . Father Deceased     Immunization History  Administered Date(s) Administered  . Tdap 05/30/2011    No Known  Allergies  Medications: Patient's Medications  New Prescriptions   No medications on file  Previous Medications   ACETAMINOPHEN (TYLENOL) 325 MG TABLET    Take 2 tablets (650 mg total) by mouth every 6 (six) hours as needed for mild pain (or Fever >/= 101).   ASPIRIN 81 MG TABLET    Take 81 mg by mouth daily.   CHOLECALCIFEROL (VITAMIN D3) 1000 UNITS CAPS    Take by mouth daily.   CLONAZEPAM (KLONOPIN) 0.5 MG TABLET    Take 1 tablet (0.5 mg total) by mouth 2 (two) times daily as needed for anxiety.   DIVALPROEX (DEPAKOTE SPRINKLE) 125 MG CAPSULE    Take 125 mg by mouth 2 (two) times daily.   DOCUSATE SODIUM (COLACE) 100 MG CAPSULE    Take 1 capsule (100 mg total) by mouth 2 (two) times daily.   DONEPEZIL (ARICEPT) 10 MG TABLET    Take 1 tablet (10 mg total) by mouth daily.   ENOXAPARIN (LOVENOX) 30 MG/0.3ML INJECTION    Inject 0.3 mLs (30 mg total) into the skin daily. Please take for 30 days then stop.   HYDROCODONE-ACETAMINOPHEN (NORCO) 7.5-325 MG TABLET    Take 1-2 tablets by mouth every 6 (six) hours as needed for moderate pain.   SERTRALINE (ZOLOFT) 50 MG TABLET    Take 75 mg by mouth daily.  Modified Medications   No medications on file  Discontinued Medications   No medications on file    Review of Systems  Unable to perform ROS: Dementia    Filed Vitals:   09/22/15 0941  BP: 137/65  Pulse: 81  Temp: 97.2 F (36.2 C)  TempSrc: Oral  Resp: 20  Height: _0  (1.499 m)  Weight: 109 lb (49.442 kg)   Body mass index is 22 kg/(m^2).  Physical Exam  Constitutional: She appears well-developed.  Frail appearing in NAD, sitting in w/c, nonverbal  HENT:  Mouth/Throat: Oropharynx is clear and moist. No oropharyngeal exudate.  MMM  Eyes: Pupils are equal, round, and reactive to light. No scleral icterus.  Neck: Neck supple. Carotid bruit is not present. No tracheal deviation present.  Cardiovascular: Normal rate, regular rhythm and intact distal pulses.  Exam reveals no  gallop and no friction rub.   Murmur (1/6 SEM) heard. No LE edema b/l. no calf TTP.   Pulmonary/Chest: Effort normal and breath sounds normal. No stridor. No respiratory distress. She has no wheezes. She has no rales.  Abdominal: Soft. Bowel sounds are normal. She exhibits no distension and no mass. There is no hepatomegaly. There is no tenderness. There is no rebound and no guarding.  Musculoskeletal: She exhibits edema.  Right lateral thigh swelling noted, NT. No redness. dsg intact  Lymphadenopathy:    She has no cervical adenopathy.  Neurological: She is alert.  Skin:  Skin is warm and dry. No rash noted.  Psychiatric: She has a normal mood and affect. Her behavior is normal.     Labs reviewed: Admission on 09/17/2015, Discharged on 09/20/2015  Component Date Value Ref Range Status  . WBC 09/17/2015 12.0* 4.0 - 10.5 K/uL Final  . RBC 09/17/2015 4.40  3.87 - 5.11 MIL/uL Final  . Hemoglobin 09/17/2015 14.7  12.0 - 15.0 g/dL Final  . HCT 09/17/2015 44.4  36.0 - 46.0 % Final  . MCV 09/17/2015 100.9* 78.0 - 100.0 fL Final  . MCH 09/17/2015 33.4  26.0 - 34.0 pg Final  . MCHC 09/17/2015 33.1  30.0 - 36.0 g/dL Final  . RDW 09/17/2015 13.3  11.5 - 15.5 % Final  . Platelets 09/17/2015 149* 150 - 400 K/uL Final  . Neutrophils Relative % 09/17/2015 86   Final  . Neutro Abs 09/17/2015 10.4* 1.7 - 7.7 K/uL Final  . Lymphocytes Relative 09/17/2015 8   Final  . Lymphs Abs 09/17/2015 0.9  0.7 - 4.0 K/uL Final  . Monocytes Relative 09/17/2015 6   Final  . Monocytes Absolute 09/17/2015 0.7  0.1 - 1.0 K/uL Final  . Eosinophils Relative 09/17/2015 0   Final  . Eosinophils Absolute 09/17/2015 0.0  0.0 - 0.7 K/uL Final  . Basophils Relative 09/17/2015 0   Final  . Basophils Absolute 09/17/2015 0.0  0.0 - 0.1 K/uL Final  . Sodium 09/17/2015 139  135 - 145 mmol/L Final  . Potassium 09/17/2015 4.2  3.5 - 5.1 mmol/L Final  . Chloride 09/17/2015 99* 101 - 111 mmol/L Final  . CO2 09/17/2015 30  22 - 32  mmol/L Final  . Glucose, Bld 09/17/2015 140* 65 - 99 mg/dL Final  . BUN 09/17/2015 12  6 - 20 mg/dL Final  . Creatinine, Ser 09/17/2015 0.64  0.44 - 1.00 mg/dL Final  . Calcium 09/17/2015 9.0  8.9 - 10.3 mg/dL Final  . GFR calc non Af Amer 09/17/2015 >60  >60 mL/min Final  . GFR calc Af Amer 09/17/2015 >60  >60 mL/min Final   Comment: (NOTE) The eGFR has been calculated using the CKD EPI equation. This calculation has not been validated in all clinical situations. eGFR's persistently <60 mL/min signify possible Chronic Kidney Disease.   . Anion gap 09/17/2015 10  5 - 15 Final  . Troponin I 09/17/2015 <0.03  <0.031 ng/mL Final   Comment:        NO INDICATION OF MYOCARDIAL INJURY.   . WBC 09/18/2015 9.1  4.0 - 10.5 K/uL Final  . RBC 09/18/2015 4.12  3.87 - 5.11 MIL/uL Final  . Hemoglobin 09/18/2015 13.4  12.0 - 15.0 g/dL Final  . HCT 09/18/2015 41.1  36.0 - 46.0 % Final  . MCV 09/18/2015 99.8  78.0 - 100.0 fL Final  . MCH 09/18/2015 32.5  26.0 - 34.0 pg Final  . MCHC 09/18/2015 32.6  30.0 - 36.0 g/dL Final  . RDW 09/18/2015 13.3  11.5 - 15.5 % Final  . Platelets 09/18/2015 140* 150 - 400 K/uL Final  . Sodium 09/18/2015 139  135 - 145 mmol/L Final  . Potassium 09/18/2015 3.7  3.5 - 5.1 mmol/L Final  . Chloride 09/18/2015 101  101 - 111 mmol/L Final  . CO2 09/18/2015 30  22 - 32 mmol/L Final  . Glucose, Bld 09/18/2015 155* 65 - 99 mg/dL Final  . BUN 09/18/2015 8  6 - 20 mg/dL Final  . Creatinine, Ser 09/18/2015 0.66  0.44 - 1.00  mg/dL Final  . Calcium 09/18/2015 8.9  8.9 - 10.3 mg/dL Final  . GFR calc non Af Amer 09/18/2015 >60  >60 mL/min Final  . GFR calc Af Amer 09/18/2015 >60  >60 mL/min Final   Comment: (NOTE) The eGFR has been calculated using the CKD EPI equation. This calculation has not been validated in all clinical situations. eGFR's persistently <60 mL/min signify possible Chronic Kidney Disease.   . Anion gap 09/18/2015 8  5 - 15 Final  . Color, Urine 09/17/2015  YELLOW  YELLOW Final  . APPearance 09/17/2015 CLEAR  CLEAR Final  . Specific Gravity, Urine 09/17/2015 1.016  1.005 - 1.030 Final  . pH 09/17/2015 7.5  5.0 - 8.0 Final  . Glucose, UA 09/17/2015 NEGATIVE  NEGATIVE mg/dL Final  . Hgb urine dipstick 09/17/2015 NEGATIVE  NEGATIVE Final  . Bilirubin Urine 09/17/2015 NEGATIVE  NEGATIVE Final  . Ketones, ur 09/17/2015 15* NEGATIVE mg/dL Final  . Protein, ur 09/17/2015 NEGATIVE  NEGATIVE mg/dL Final  . Nitrite 09/17/2015 NEGATIVE  NEGATIVE Final  . Leukocytes, UA 09/17/2015 NEGATIVE  NEGATIVE Final   MICROSCOPIC NOT DONE ON URINES WITH NEGATIVE PROTEIN, BLOOD, LEUKOCYTES, NITRITE, OR GLUCOSE <1000 mg/dL.  Marland Kitchen MRSA by PCR 09/17/2015 NEGATIVE  NEGATIVE Final   Comment:        The GeneXpert MRSA Assay (FDA approved for NASAL specimens only), is one component of a comprehensive MRSA colonization surveillance program. It is not intended to diagnose MRSA infection nor to guide or monitor treatment for MRSA infections.   . WBC 09/19/2015 9.8  4.0 - 10.5 K/uL Final  . RBC 09/19/2015 2.50* 3.87 - 5.11 MIL/uL Final  . Hemoglobin 09/19/2015 8.1* 12.0 - 15.0 g/dL Final   Comment: DELTA CHECK NOTED REPEATED TO VERIFY   . HCT 09/19/2015 24.5* 36.0 - 46.0 % Final  . MCV 09/19/2015 98.0  78.0 - 100.0 fL Final  . MCH 09/19/2015 32.4  26.0 - 34.0 pg Final  . MCHC 09/19/2015 33.1  30.0 - 36.0 g/dL Final  . RDW 09/19/2015 13.6  11.5 - 15.5 % Final  . Platelets 09/19/2015 141* 150 - 400 K/uL Final  . Sodium 09/19/2015 136  135 - 145 mmol/L Final  . Potassium 09/19/2015 4.4  3.5 - 5.1 mmol/L Final  . Chloride 09/19/2015 101  101 - 111 mmol/L Final  . CO2 09/19/2015 24  22 - 32 mmol/L Final  . Glucose, Bld 09/19/2015 152* 65 - 99 mg/dL Final  . BUN 09/19/2015 11  6 - 20 mg/dL Final  . Creatinine, Ser 09/19/2015 0.59  0.44 - 1.00 mg/dL Final  . Calcium 09/19/2015 8.6* 8.9 - 10.3 mg/dL Final  . GFR calc non Af Amer 09/19/2015 >60  >60 mL/min Final  . GFR  calc Af Amer 09/19/2015 >60  >60 mL/min Final   Comment: (NOTE) The eGFR has been calculated using the CKD EPI equation. This calculation has not been validated in all clinical situations. eGFR's persistently <60 mL/min signify possible Chronic Kidney Disease.   . Anion gap 09/19/2015 11  5 - 15 Final  . ABO/RH(D) 09/19/2015 O POS   Final  . Antibody Screen 09/19/2015 NEG   Final  . Sample Expiration 09/19/2015 09/22/2015   Final  . Unit Number 09/19/2015 [id number]   Final  . Blood Component Type 09/19/2015 RED CELLS,LR   Final  . Unit division 09/19/2015 00   Final  . Status of Unit 09/19/2015 ISSUED,FINAL   Final  . Transfusion Status 09/19/2015 OK TO  TRANSFUSE   Final  . Crossmatch Result 09/19/2015 Compatible   Final  . Order Confirmation 09/19/2015 ORDER PROCESSED BY BLOOD BANK   Final  . ABO/RH(D) 09/19/2015 O POS   Final  . WBC 09/20/2015 8.4  4.0 - 10.5 K/uL Final  . RBC 09/20/2015 3.10* 3.87 - 5.11 MIL/uL Final  . Hemoglobin 09/20/2015 9.5* 12.0 - 15.0 g/dL Final  . HCT 09/20/2015 29.2* 36.0 - 46.0 % Final  . MCV 09/20/2015 94.2  78.0 - 100.0 fL Final  . MCH 09/20/2015 30.6  26.0 - 34.0 pg Final  . MCHC 09/20/2015 32.5  30.0 - 36.0 g/dL Final  . RDW 09/20/2015 16.7* 11.5 - 15.5 % Final  . Platelets 09/20/2015 111* 150 - 400 K/uL Final   PLATELET COUNT CONFIRMED BY SMEAR  . Sodium 09/20/2015 140  135 - 145 mmol/L Final  . Potassium 09/20/2015 3.9  3.5 - 5.1 mmol/L Final  . Chloride 09/20/2015 106  101 - 111 mmol/L Final  . CO2 09/20/2015 25  22 - 32 mmol/L Final  . Glucose, Bld 09/20/2015 147* 65 - 99 mg/dL Final  . BUN 09/20/2015 11  6 - 20 mg/dL Final  . Creatinine, Ser 09/20/2015 0.58  0.44 - 1.00 mg/dL Final  . Calcium 09/20/2015 8.5* 8.9 - 10.3 mg/dL Final  . GFR calc non Af Amer 09/20/2015 >60  >60 mL/min Final  . GFR calc Af Amer 09/20/2015 >60  >60 mL/min Final   Comment: (NOTE) The eGFR has been calculated using the CKD EPI equation. This calculation has  not been validated in all clinical situations. eGFR's persistently <60 mL/min signify possible Chronic Kidney Disease.   . Anion gap 09/20/2015 9  5 - 15 Final    Ct Hip Right Wo Contrast  09/17/2015  CLINICAL DATA:  Fall at nursing home, right hip pain. EXAM: CT OF THE RIGHT HIP WITHOUT CONTRAST TECHNIQUE: Multidetector CT imaging of the right hip was performed according to the standard protocol. Multiplanar CT image reconstructions were also generated. COMPARISON:  Radiographs from 09/17/2015 FINDINGS: Nondisplaced intertrochanteric fracture. There is a mildly displaced greater trochanteric fragment but the extension over to the lesser trochanter is subtle. Visualized right hemipelvis unremarkable. Mild spurring of the acetabulum and femoral head. Vascular calcifications noted. Direct inguinal hernia contains adipose tissue. Abnormal subcutaneous bruising adjacent to the greater trochanter. IMPRESSION: 1. Nondisplaced intertrochanteric fracture, also including a mildly displaced transverse greater trochanteric fragment. 2. Direct right inguinal hernia contains adipose tissue. 3. Abnormal subcutaneous hematoma/bruising adjacent to the greater trochanter. Electronically Signed   By: Van Clines M.D.   On: 09/17/2015 13:39   Dg Chest Port 1 View  09/17/2015  CLINICAL DATA:  Jerelyn Scott twice today. EXAM: PORTABLE CHEST 1 VIEW COMPARISON:  04/05/2015 and 01/17/2014 FINDINGS: Lungs are adequately inflated without focal consolidation or effusion. Cardiomediastinal silhouette is within normal. There is calcified plaque over the thoracic aorta. Old bilateral rib fractures and possible acute to subacute posterior lateral right ninth rib fracture. Degenerative change of the spine. IMPRESSION: No acute cardiopulmonary disease. Old bilateral rib fractures. Acute to subacute right posterior lateral ninth rib fracture. Aortic atherosclerosis. Electronically Signed   By: Marin Olp M.D.   On: 09/17/2015 14:15    Dg Knee Complete 4 Views Right  09/17/2015  CLINICAL DATA:  Status post fall this morning with a right knee injury. Pain. Initial encounter. EXAM: RIGHT KNEE - COMPLETE 4+ VIEW COMPARISON:  None. FINDINGS: No evidence of fracture, dislocation, or joint effusion. No evidence of  arthropathy or other focal bone abnormality. Atherosclerotic vascular disease noted. IMPRESSION: No acute abnormality. Atherosclerosis. Electronically Signed   By: Inge Rise M.D.   On: 09/17/2015 10:05   Dg Hip Operative Unilat W Or W/o Pelvis Right  09/18/2015  CLINICAL DATA:  IM nail to right femur EXAM: OPERATIVE RIGHT HIP (WITH PELVIS IF PERFORMED) 2 VIEWS TECHNIQUE: Fluoroscopic spot image(s) were submitted for interpretation post-operatively. COMPARISON:  09/17/2015 FINDINGS: Internal fixation across the right intertrochanteric fracture. Anatomic alignment. No hardware bony complicating feature. IMPRESSION: Internal fixation.  No visible complicating feature. Electronically Signed   By: Rolm Baptise M.D.   On: 09/18/2015 11:24   Dg Hip Unilat With Pelvis 2-3 Views Right  09/17/2015  CLINICAL DATA:  Status post fall this morning.  Right hip pain. EXAM: DG HIP (WITH OR WITHOUT PELVIS) 2-3V RIGHT COMPARISON:  None. FINDINGS: There is no evidence of hip fracture or dislocation. Mild bilateral hip degenerative disease is seen. Atherosclerosis is noted. IMPRESSION: No acute abnormality. Mild bilateral hip degenerative change. Electronically Signed   By: Inge Rise M.D.   On: 09/17/2015 08:39     Assessment/Plan   ICD-9-CM ICD-10-CM   1. Fracture, intertrochanteric, right femur, sequela 905.3 S72.141S    s/p intramedullary implant  2. Depression with anxiety 300.4 F41.8   3. Slow transit constipation 564.01 K59.01   4. Dementia with behavioral disturbance 294.21 F03.91   5. History of stroke V12.54 Z86.73   6. Acute blood loss anemia s/p 1 unit PRBC 285.1 D62    Check CBC, BMP, depakote level  Cont  current meds as ordered  F/u with Ortho as scheduled  Wound care as indicated  Fall precautions  PT/OT/ST as ordered  GOAL: short term rehab and d/c home when medically appropriate. Communicated with pt and nursing.  Will follow  Tanay Misuraca S. Perlie Gold  [facility] [address] [place], [place] [address] [phone] Cell (Monday-Friday 8 AM - 5 PM) [phone] After 5 PM and follow prompts

## 2015-09-24 ENCOUNTER — Ambulatory Visit: Payer: Medicare Other | Admitting: Podiatry

## 2015-09-27 ENCOUNTER — Inpatient Hospital Stay (HOSPITAL_COMMUNITY)
Admission: EM | Admit: 2015-09-27 | Discharge: 2015-10-27 | DRG: 871 | Disposition: E | Payer: Medicare Other | Attending: Internal Medicine | Admitting: Internal Medicine

## 2015-09-27 ENCOUNTER — Encounter (HOSPITAL_COMMUNITY): Payer: Self-pay | Admitting: Neurology

## 2015-09-27 ENCOUNTER — Emergency Department (HOSPITAL_COMMUNITY): Payer: Medicare Other

## 2015-09-27 DIAGNOSIS — J189 Pneumonia, unspecified organism: Secondary | ICD-10-CM | POA: Diagnosis present

## 2015-09-27 DIAGNOSIS — H9190 Unspecified hearing loss, unspecified ear: Secondary | ICD-10-CM | POA: Diagnosis present

## 2015-09-27 DIAGNOSIS — R0902 Hypoxemia: Secondary | ICD-10-CM | POA: Diagnosis present

## 2015-09-27 DIAGNOSIS — A419 Sepsis, unspecified organism: Secondary | ICD-10-CM | POA: Diagnosis not present

## 2015-09-27 DIAGNOSIS — A4159 Other Gram-negative sepsis: Secondary | ICD-10-CM | POA: Diagnosis not present

## 2015-09-27 DIAGNOSIS — R402432 Glasgow coma scale score 3-8, at arrival to emergency department: Secondary | ICD-10-CM | POA: Diagnosis present

## 2015-09-27 DIAGNOSIS — Z7982 Long term (current) use of aspirin: Secondary | ICD-10-CM

## 2015-09-27 DIAGNOSIS — E871 Hypo-osmolality and hyponatremia: Secondary | ICD-10-CM | POA: Diagnosis present

## 2015-09-27 DIAGNOSIS — Z515 Encounter for palliative care: Secondary | ICD-10-CM | POA: Diagnosis present

## 2015-09-27 DIAGNOSIS — K7290 Hepatic failure, unspecified without coma: Secondary | ICD-10-CM | POA: Diagnosis present

## 2015-09-27 DIAGNOSIS — E870 Hyperosmolality and hypernatremia: Secondary | ICD-10-CM | POA: Diagnosis present

## 2015-09-27 DIAGNOSIS — D649 Anemia, unspecified: Secondary | ICD-10-CM | POA: Diagnosis present

## 2015-09-27 DIAGNOSIS — N390 Urinary tract infection, site not specified: Secondary | ICD-10-CM | POA: Diagnosis present

## 2015-09-27 DIAGNOSIS — R945 Abnormal results of liver function studies: Secondary | ICD-10-CM | POA: Diagnosis present

## 2015-09-27 DIAGNOSIS — Z66 Do not resuscitate: Secondary | ICD-10-CM | POA: Diagnosis present

## 2015-09-27 DIAGNOSIS — B179 Acute viral hepatitis, unspecified: Secondary | ICD-10-CM | POA: Diagnosis present

## 2015-09-27 DIAGNOSIS — Y95 Nosocomial condition: Secondary | ICD-10-CM | POA: Diagnosis present

## 2015-09-27 DIAGNOSIS — Z85820 Personal history of malignant melanoma of skin: Secondary | ICD-10-CM | POA: Diagnosis not present

## 2015-09-27 DIAGNOSIS — B9620 Unspecified Escherichia coli [E. coli] as the cause of diseases classified elsewhere: Secondary | ICD-10-CM | POA: Diagnosis present

## 2015-09-27 DIAGNOSIS — K7689 Other specified diseases of liver: Secondary | ICD-10-CM

## 2015-09-27 DIAGNOSIS — F0390 Unspecified dementia without behavioral disturbance: Secondary | ICD-10-CM | POA: Diagnosis present

## 2015-09-27 DIAGNOSIS — I213 ST elevation (STEMI) myocardial infarction of unspecified site: Secondary | ICD-10-CM | POA: Diagnosis present

## 2015-09-27 DIAGNOSIS — E1165 Type 2 diabetes mellitus with hyperglycemia: Secondary | ICD-10-CM | POA: Diagnosis present

## 2015-09-27 DIAGNOSIS — E119 Type 2 diabetes mellitus without complications: Secondary | ICD-10-CM

## 2015-09-27 DIAGNOSIS — N179 Acute kidney failure, unspecified: Secondary | ICD-10-CM | POA: Diagnosis present

## 2015-09-27 DIAGNOSIS — R6521 Severe sepsis with septic shock: Secondary | ICD-10-CM | POA: Diagnosis present

## 2015-09-27 DIAGNOSIS — Z8673 Personal history of transient ischemic attack (TIA), and cerebral infarction without residual deficits: Secondary | ICD-10-CM | POA: Diagnosis not present

## 2015-09-27 DIAGNOSIS — Z79899 Other long term (current) drug therapy: Secondary | ICD-10-CM | POA: Diagnosis not present

## 2015-09-27 LAB — URINE MICROSCOPIC-ADD ON

## 2015-09-27 LAB — CBC WITH DIFFERENTIAL/PLATELET
BASOS ABS: 0.2 10*3/uL — AB (ref 0.0–0.1)
BASOS ABS: 0.2 10*3/uL — AB (ref 0.0–0.1)
BASOS PCT: 1 %
Basophils Relative: 1 %
Eosinophils Absolute: 0 10*3/uL (ref 0.0–0.7)
Eosinophils Absolute: 0 10*3/uL (ref 0.0–0.7)
Eosinophils Relative: 0 %
Eosinophils Relative: 0 %
HCT: 35.2 % — ABNORMAL LOW (ref 36.0–46.0)
HEMATOCRIT: 35.3 % — AB (ref 36.0–46.0)
HEMOGLOBIN: 10.4 g/dL — AB (ref 12.0–15.0)
Hemoglobin: 10.4 g/dL — ABNORMAL LOW (ref 12.0–15.0)
LYMPHS ABS: 0.9 10*3/uL (ref 0.7–4.0)
LYMPHS ABS: 3 10*3/uL (ref 0.7–4.0)
Lymphocytes Relative: 5 %
Lymphocytes Relative: 15 %
MCH: 32.4 pg (ref 26.0–34.0)
MCH: 32.1 pg (ref 26.0–34.0)
MCHC: 29.5 g/dL — AB (ref 30.0–36.0)
MCHC: 29.5 g/dL — AB (ref 30.0–36.0)
MCV: 109.7 fL — ABNORMAL HIGH (ref 78.0–100.0)
MCV: 109 fL — AB (ref 78.0–100.0)
MONO ABS: 0.9 10*3/uL (ref 0.1–1.0)
MONOS PCT: 5 %
Monocytes Absolute: 1 10*3/uL (ref 0.1–1.0)
Monocytes Relative: 5 %
NEUTROS ABS: 16 10*3/uL — AB (ref 1.7–7.7)
NEUTROS PCT: 89 %
Neutro Abs: 15.6 10*3/uL — ABNORMAL HIGH (ref 1.7–7.7)
Neutrophils Relative %: 79 %
Platelets: 201 10*3/uL (ref 150–400)
Platelets: 122 10*3/uL — ABNORMAL LOW (ref 150–400)
RBC: 3.21 MIL/uL — AB (ref 3.87–5.11)
RBC: 3.24 MIL/uL — ABNORMAL LOW (ref 3.87–5.11)
RDW: 20.2 % — AB (ref 11.5–15.5)
RDW: 20.3 % — ABNORMAL HIGH (ref 11.5–15.5)
WBC: 17.6 10*3/uL — AB (ref 4.0–10.5)
WBC: 20.2 10*3/uL — ABNORMAL HIGH (ref 4.0–10.5)

## 2015-09-27 LAB — URINALYSIS, ROUTINE W REFLEX MICROSCOPIC
GLUCOSE, UA: NEGATIVE mg/dL
Ketones, ur: 15 mg/dL — AB
Nitrite: NEGATIVE
Protein, ur: NEGATIVE mg/dL
SPECIFIC GRAVITY, URINE: 1.023 (ref 1.005–1.030)
pH: 5.5 (ref 5.0–8.0)

## 2015-09-27 LAB — COMPREHENSIVE METABOLIC PANEL
ALT: 1250 U/L — AB (ref 14–54)
AST: 1670 U/L — AB (ref 15–41)
Albumin: 2.8 g/dL — ABNORMAL LOW (ref 3.5–5.0)
Alkaline Phosphatase: 101 U/L (ref 38–126)
Anion gap: 15 (ref 5–15)
BILIRUBIN TOTAL: 3.3 mg/dL — AB (ref 0.3–1.2)
BUN: 135 mg/dL — AB (ref 6–20)
CO2: 22 mmol/L (ref 22–32)
CREATININE: 2.93 mg/dL — AB (ref 0.44–1.00)
Calcium: 8 mg/dL — ABNORMAL LOW (ref 8.9–10.3)
Chloride: 124 mmol/L — ABNORMAL HIGH (ref 101–111)
GFR, EST AFRICAN AMERICAN: 15 mL/min — AB (ref >60)
GFR, EST NON AFRICAN AMERICAN: 13 mL/min — AB (ref >60)
Glucose, Bld: 231 mg/dL — ABNORMAL HIGH (ref 65–99)
POTASSIUM: 4.6 mmol/L (ref 3.5–5.1)
Sodium: 161 mmol/L (ref 135–145)
TOTAL PROTEIN: 5.5 g/dL — AB (ref 6.5–8.1)

## 2015-09-27 LAB — CBG MONITORING, ED: Glucose-Capillary: 212 mg/dL — ABNORMAL HIGH (ref 65–99)

## 2015-09-27 LAB — I-STAT TROPONIN, ED: TROPONIN I, POC: 5.75 ng/mL — AB (ref 0.00–0.08)

## 2015-09-27 LAB — I-STAT CG4 LACTIC ACID, ED: LACTIC ACID, VENOUS: 3.81 mmol/L — AB (ref 0.5–1.9)

## 2015-09-27 MED ORDER — SODIUM CHLORIDE 0.9 % IV BOLUS (SEPSIS)
500.0000 mL | Freq: Once | INTRAVENOUS | Status: AC
  Administered 2015-09-27: 500 mL via INTRAVENOUS

## 2015-09-27 MED ORDER — SCOPOLAMINE 1 MG/3DAYS TD PT72
1.0000 | MEDICATED_PATCH | TRANSDERMAL | Status: DC
  Filled 2015-09-27: qty 1

## 2015-09-27 MED ORDER — PIPERACILLIN-TAZOBACTAM IN DEX 2-0.25 GM/50ML IV SOLN
2.2500 g | Freq: Three times a day (TID) | INTRAVENOUS | Status: DC
  Filled 2015-09-27 (×2): qty 50

## 2015-09-27 MED ORDER — PIPERACILLIN-TAZOBACTAM 3.375 G IVPB 30 MIN
3.3750 g | Freq: Once | INTRAVENOUS | Status: AC
  Administered 2015-09-27: 3.375 g via INTRAVENOUS
  Filled 2015-09-27: qty 50

## 2015-09-27 MED ORDER — SODIUM CHLORIDE 0.9 % IV SOLN: INTRAVENOUS | Status: DC

## 2015-09-27 MED ORDER — SODIUM CHLORIDE 0.9 % IV BOLUS (SEPSIS)
250.0000 mL | Freq: Once | INTRAVENOUS | Status: AC
Start: 2015-09-27 — End: 2015-09-27
  Administered 2015-09-27: 250 mL via INTRAVENOUS

## 2015-09-27 MED ORDER — MORPHINE SULFATE (PF) 2 MG/ML IV SOLN: 1.0000 mg | INTRAVENOUS | Status: DC | PRN

## 2015-09-27 MED ORDER — VANCOMYCIN HCL IN DEXTROSE 1-5 GM/200ML-% IV SOLN
1000.0000 mg | Freq: Once | INTRAVENOUS | Status: AC
  Administered 2015-09-27: 1000 mg via INTRAVENOUS
  Filled 2015-09-27: qty 200

## 2015-09-27 MED ORDER — LEVOFLOXACIN IN D5W 500 MG/100ML IV SOLN
500.0000 mg | Freq: Once | INTRAVENOUS | Status: DC
  Filled 2015-09-27: qty 100

## 2015-09-27 MED ORDER — INSULIN ASPART 100 UNIT/ML ~~LOC~~ SOLN: 0.0000 [IU] | Freq: Three times a day (TID) | SUBCUTANEOUS | Status: DC

## 2015-09-27 MED ORDER — HEPARIN SODIUM (PORCINE) 5000 UNIT/ML IJ SOLN: 5000.0000 [IU] | Freq: Three times a day (TID) | INTRAMUSCULAR | Status: DC

## 2015-09-27 MED ORDER — SODIUM CHLORIDE 0.9 % IV BOLUS (SEPSIS)
1000.0000 mL | Freq: Once | INTRAVENOUS | Status: AC
  Administered 2015-09-27: 1000 mL via INTRAVENOUS

## 2015-09-27 NOTE — ED Provider Notes (Signed)
CSN: [id number]     Arrival date & time 09/26/2015  1015 History   First MD Initiated Contact with Patient 10/08/2015 1016     Chief Complaint  Patient presents with  . Altered Mental Status   Low 5 caveat patient demented and unresponsive. History is obtained from old records and from Mathis Dad, nurse at skilled nursing facility where patient resides  (Consider location/radiation/quality/duration/timing/severity/associated sxs/prior Treatment) HPI Patient found unresponsive at 9:30 AM at skilled nursing facility. She was "moving around in bed last night" as of 8:30 PM. EMS was called this morning and noted patient to be hypoxic with pulse oximetry of 86 on room air. EMS treated patient with supplemental oxygen. Past Medical History  Diagnosis Date  . Insomnia, unspecified   . Hard of hearing 06/25/2012  . Stroke ([place])     tia  . Cancer ([place])     skin  . Dementia   . Dementia with behavioral disturbance 06/25/2012  . Anxiety    Past Surgical History  Procedure Laterality Date  . No past surgeries    . Excision melanoma with sentinel lymph node biopsy Left 10/07/2013    Procedure:  LEFT UPPER EXTREMITY EXCISIONAL BIOPSY;  Surgeon: Ralene Ok, MD;  Location: [place];  Service: General;  Laterality: Left;  . Intramedullary (im) nail intertrochanteric Right 09/18/2015    Procedure: INTRAMEDULLARY (IM) NAIL INTERTROCHANTRIC FEMUR FRACTURE;  Surgeon: Leandrew Koyanagi, MD;  Location: [place];  Service: Orthopedics;  Laterality: Right;   Family History  Problem Relation Age of Onset  . Other Mother   . Other Father   . Heart attack Father    Social History  Substance Use Topics  . Smoking status: Never Smoker   . Smokeless tobacco: Never Used  . Alcohol Use: No   OB History    No data available     Review of Systems  Unable to perform ROS: Mental status change      Allergies  Review of patient's allergies indicates no known allergies.  Home Medications   Prior to  Admission medications   Medication Sig Start Date End Date Taking? Authorizing Provider  acetaminophen (TYLENOL) 325 MG tablet Take 2 tablets (650 mg total) by mouth every 6 (six) hours as needed for mild pain (or Fever >/= 101). 09/20/15   Albertine Patricia, MD  aspirin 81 MG tablet Take 81 mg by mouth daily.    Historical Provider, MD  Cholecalciferol (VITAMIN D3) 1000 units CAPS Take by mouth daily.    Historical Provider, MD  clonazePAM (KLONOPIN) 0.5 MG tablet Take 1 tablet (0.5 mg total) by mouth 2 (two) times daily as needed for anxiety. 09/20/15   Silver Huguenin Elgergawy, MD  divalproex (DEPAKOTE SPRINKLE) 125 MG capsule Take 125 mg by mouth 2 (two) times daily.    Historical Provider, MD  docusate sodium (COLACE) 100 MG capsule Take 1 capsule (100 mg total) by mouth 2 (two) times daily. 09/20/15   Silver Huguenin Elgergawy, MD  donepezil (ARICEPT) 10 MG tablet Take 1 tablet (10 mg total) by mouth daily. 07/29/13   Dennie Bible, NP  enoxaparin (LOVENOX) 30 MG/0.3ML injection Inject 0.3 mLs (30 mg total) into the skin daily. Please take for 30 days then stop. 09/20/15 10/18/15  Silver Huguenin Elgergawy, MD  HYDROcodone-acetaminophen (NORCO) 7.5-325 MG tablet Take 1-2 tablets by mouth every 6 (six) hours as needed for moderate pain. 09/18/15   Leandrew Koyanagi, MD  sertraline (ZOLOFT) 50 MG tablet Take  75 mg by mouth daily.    Historical Provider, MD   BP 89/62 mmHg  Pulse 119  Temp(Src) 104.4 F (40.2 C) (Rectal)  Resp 44  SpO2 96% Physical Exam  Constitutional: She appears distressed.  Acutely and chronically ill-appearing. Unresponsive Glasgow Coma Score 3  HENT:  Head: Normocephalic and atraumatic.  Mucous membranes dry  Eyes: Conjunctivae are normal.  Neck: Neck supple. No tracheal deviation present. No thyromegaly present.  Cardiovascular: Normal rate and regular rhythm.   No murmur heard. Pulmonary/Chest: She is in respiratory distress.  Tachypnea,. Diffuse coarse rhonchi  Abdominal: Soft.  Bowel sounds are normal. She exhibits no distension. There is no tenderness.  Musculoskeletal: Normal range of motion. She exhibits no edema or tenderness.  Diffuse clean appearing stapled surgical wounds at right hip.  Neurological:  Unresponsive Glasgow Coma Score 3  Skin: Skin is warm and dry. No rash noted.  Ecchymotic at right hip and proximal right thigh. Otherwise without rash  Psychiatric: She has a normal mood and affect.  Nursing note and vitals reviewed.   ED Course  Procedures (including critical care time) Labs Review Labs Reviewed  CBG MONITORING, ED - Abnormal; Notable for the following:    Glucose-Capillary 212 (*)    All other components within normal limits  CULTURE, BLOOD (ROUTINE X 2)  CULTURE, BLOOD (ROUTINE X 2)  URINE CULTURE  COMPREHENSIVE METABOLIC PANEL  CBC WITH DIFFERENTIAL/PLATELET  URINALYSIS, ROUTINE W REFLEX MICROSCOPIC (NOT AT [facility])  I-STAT CG4 LACTIC ACID, ED  I-STAT TROPOININ, ED    Imaging Review No results found. I have personally reviewed and evaluated these images and lab results as part of my medical decision-making.   EKG Interpretation   Date/Time:  Sunday September 27 2015 10:16:57 EDT Ventricular Rate:  118 PR Interval:    QRS Duration: 79 QT Interval:  313 QTC Calculation: 439 R Axis:   11 Text Interpretation:  Sinus tachycardia LVH with secondary repolarization  abnormality Anterior ST elevation, probably due to LVH Confirmed by  Winfred Leeds  MD, Ahnaf Caponi [phone]) on 10/05/2015 10:37:00 AM     A she is DO NOT RESUSCITATE CODE STATUS. I attempted to call patient daughter-in-law on resident transfer form, no answer. Code sepsis called At 12:40 PM patient's daughter-in-law and healthcare power of attorney Patience Musca returned my call and states that patient wanted comfort measures only, and had voiced that opinion while she was still mentally competent. Her most recent baseline at the status resolution is that she does not recognize  family members while at skilled nursing facility. Chest x-ray viewed by me Results for orders placed or performed during the hospital encounter of 10/06/2015  Comprehensive metabolic panel  Result Value Ref Range   Sodium 161 (HH) 135 - 145 mmol/L   Potassium 4.6 3.5 - 5.1 mmol/L   Chloride 124 (H) 101 - 111 mmol/L   CO2 22 22 - 32 mmol/L   Glucose, Bld 231 (H) 65 - 99 mg/dL   BUN 135 (H) 6 - 20 mg/dL   Creatinine, Ser 2.93 (H) 0.44 - 1.00 mg/dL   Calcium 8.0 (L) 8.9 - 10.3 mg/dL   Total Protein 5.5 (L) 6.5 - 8.1 g/dL   Albumin 2.8 (L) 3.5 - 5.0 g/dL   AST 1670 (H) 15 - 41 U/L   ALT 1250 (H) 14 - 54 U/L   Alkaline Phosphatase 101 38 - 126 U/L   Total Bilirubin 3.3 (H) 0.3 - 1.2 mg/dL   GFR calc non Af Wyvonnia Lora  13 (L) >60 mL/min   GFR calc Af Amer 15 (L) >60 mL/min   Anion gap 15 5 - 15  CBC WITH DIFFERENTIAL  Result Value Ref Range   WBC 17.6 (H) 4.0 - 10.5 K/uL   RBC 3.21 (L) 3.87 - 5.11 MIL/uL   Hemoglobin 10.4 (L) 12.0 - 15.0 g/dL   HCT 35.2 (L) 36.0 - 46.0 %   MCV 109.7 (H) 78.0 - 100.0 fL   MCH 32.4 26.0 - 34.0 pg   MCHC 29.5 (L) 30.0 - 36.0 g/dL   RDW 20.2 (H) 11.5 - 15.5 %   Platelets 201 150 - 400 K/uL   Neutrophils Relative % 89 %   Lymphocytes Relative 5 %   Monocytes Relative 5 %   Eosinophils Relative 0 %   Basophils Relative 1 %   Neutro Abs 15.6 (H) 1.7 - 7.7 K/uL   Lymphs Abs 0.9 0.7 - 4.0 K/uL   Monocytes Absolute 0.9 0.1 - 1.0 K/uL   Eosinophils Absolute 0.0 0.0 - 0.7 K/uL   Basophils Absolute 0.2 (H) 0.0 - 0.1 K/uL   RBC Morphology POLYCHROMASIA PRESENT    WBC Morphology MILD LEFT SHIFT (1-5% METAS, OCC MYELO, OCC BANDS)    Smear Review LARGE PLATELETS PRESENT   Urinalysis, Routine w reflex microscopic (not at [facility])  Result Value Ref Range   Color, Urine ORANGE (A) YELLOW   APPearance TURBID (A) CLEAR   Specific Gravity, Urine 1.023 1.005 - 1.030   pH 5.5 5.0 - 8.0   Glucose, UA NEGATIVE NEGATIVE mg/dL   Hgb urine dipstick MODERATE (A) NEGATIVE    Bilirubin Urine SMALL (A) NEGATIVE   Ketones, ur 15 (A) NEGATIVE mg/dL   Protein, ur NEGATIVE NEGATIVE mg/dL   Nitrite NEGATIVE NEGATIVE   Leukocytes, UA MODERATE (A) NEGATIVE  Urine microscopic-add on  Result Value Ref Range   Squamous Epithelial / LPF 6-30 (A) NONE SEEN   WBC, UA 6-30 0 - 5 WBC/hpf   RBC / HPF 6-30 0 - 5 RBC/hpf   Bacteria, UA MANY (A) NONE SEEN   Casts HYALINE CASTS (A) NEGATIVE  CBG monitoring, ED  Result Value Ref Range   Glucose-Capillary 212 (H) 65 - 99 mg/dL  I-Stat CG4 Lactic Acid, ED  (not at  [facility])  Result Value Ref Range   Lactic Acid, Venous 3.81 (HH) 0.5 - 1.9 mmol/L   Comment NOTIFIED PHYSICIAN   I-stat troponin, ED  Result Value Ref Range   Troponin i, poc 5.75 (HH) 0.00 - 0.08 ng/mL   Comment NOTIFIED PHYSICIAN    Comment 3           Ct Hip Right Wo Contrast  09/17/2015  CLINICAL DATA:  Fall at nursing home, right hip pain. EXAM: CT OF THE RIGHT HIP WITHOUT CONTRAST TECHNIQUE: Multidetector CT imaging of the right hip was performed according to the standard protocol. Multiplanar CT image reconstructions were also generated. COMPARISON:  Radiographs from 09/17/2015 FINDINGS: Nondisplaced intertrochanteric fracture. There is a mildly displaced greater trochanteric fragment but the extension over to the lesser trochanter is subtle. Visualized right hemipelvis unremarkable. Mild spurring of the acetabulum and femoral head. Vascular calcifications noted. Direct inguinal hernia contains adipose tissue. Abnormal subcutaneous bruising adjacent to the greater trochanter. IMPRESSION: 1. Nondisplaced intertrochanteric fracture, also including a mildly displaced transverse greater trochanteric fragment. 2. Direct right inguinal hernia contains adipose tissue. 3. Abnormal subcutaneous hematoma/bruising adjacent to the greater trochanter. Electronically Signed   By: Cindra Eves.D.  On: 09/17/2015 13:39   Dg Chest Port 1 View  10/05/2015  CLINICAL DATA:   Fever.  Decreased mental status. EXAM: PORTABLE CHEST 1 VIEW COMPARISON:  09/17/2015 FINDINGS: Mild cardiomegaly stable.  Aortic atherosclerosis noted. New pulmonary opacity is seen in both lung bases. No evidence of pneumothorax or pleural effusion. Old left-sided rib fracture deformities again noted. IMPRESSION: New bibasilar pulmonary opacity, which may be due to pneumonia or atelectasis. Stable cardiomegaly.  Aortic atherosclerosis noted. Electronically Signed   By: Earle Gell M.D.   On: 10/16/2015 10:54   Dg Chest Port 1 View  09/17/2015  CLINICAL DATA:  Jerelyn Scott twice today. EXAM: PORTABLE CHEST 1 VIEW COMPARISON:  04/05/2015 and 01/17/2014 FINDINGS: Lungs are adequately inflated without focal consolidation or effusion. Cardiomediastinal silhouette is within normal. There is calcified plaque over the thoracic aorta. Old bilateral rib fractures and possible acute to subacute posterior lateral right ninth rib fracture. Degenerative change of the spine. IMPRESSION: No acute cardiopulmonary disease. Old bilateral rib fractures. Acute to subacute right posterior lateral ninth rib fracture. Aortic atherosclerosis. Electronically Signed   By: Marin Olp M.D.   On: 09/17/2015 14:15   Dg Knee Complete 4 Views Right  09/17/2015  CLINICAL DATA:  Status post fall this morning with a right knee injury. Pain. Initial encounter. EXAM: RIGHT KNEE - COMPLETE 4+ VIEW COMPARISON:  None. FINDINGS: No evidence of fracture, dislocation, or joint effusion. No evidence of arthropathy or other focal bone abnormality. Atherosclerotic vascular disease noted. IMPRESSION: No acute abnormality. Atherosclerosis. Electronically Signed   By: Inge Rise M.D.   On: 09/17/2015 10:05   Dg Hip Operative Unilat W Or W/o Pelvis Right  09/18/2015  CLINICAL DATA:  IM nail to right femur EXAM: OPERATIVE RIGHT HIP (WITH PELVIS IF PERFORMED) 2 VIEWS TECHNIQUE: Fluoroscopic spot image(s) were submitted for interpretation post-operatively.  COMPARISON:  09/17/2015 FINDINGS: Internal fixation across the right intertrochanteric fracture. Anatomic alignment. No hardware bony complicating feature. IMPRESSION: Internal fixation.  No visible complicating feature. Electronically Signed   By: Rolm Baptise M.D.   On: 09/18/2015 11:24   Dg Hip Unilat With Pelvis 2-3 Views Right  09/17/2015  CLINICAL DATA:  Status post fall this morning.  Right hip pain. EXAM: DG HIP (WITH OR WITHOUT PELVIS) 2-3V RIGHT COMPARISON:  None. FINDINGS: There is no evidence of hip fracture or dislocation. Mild bilateral hip degenerative disease is seen. Atherosclerosis is noted. IMPRESSION: No acute abnormality. Mild bilateral hip degenerative change. Electronically Signed   By: Inge Rise M.D.   On: 09/17/2015 08:39    MDM  Patient's condition is moribund. I believe that she has overwhelming sepsis and septic shock. At 12:15 PM she remains hypotensive after intravenous fluid bolus and intravenous antibiotics. Dr. Maudie Mercury from hospitalist consulted and will arrange for admission. Palliative care consult ordered Final diagnoses:  None  Diagnoses #1 septic shock #2 hyponatremia #3 renal failure #4liver failure   CRITICAL CARE Performed by: Orlie Dakin Total critical care time: 60 minutes Critical care time was exclusive of separately billable procedures and treating other patients. Critical care was necessary to treat or prevent imminent or life-threatening deterioration. Critical care was time spent personally by me on the following activities: development of treatment plan with patient and/or surrogate as well as nursing, discussions with consultants, evaluation of patient's response to treatment, examination of patient, obtaining history from patient or surrogate, ordering and performing treatments and interventions, ordering and review of laboratory studies, ordering and review of radiographic studies, pulse oximetry and  re-evaluation of patient's  condition.     Orlie Dakin, MD 09/29/2015 1248

## 2015-09-27 NOTE — H&P (Addendum)
TRH H&P   Patient Demographics:    Christina Summers, is a 83 y.o. female  MRN: [id number]   DOB - 12/12/1924  Admit Date - 10/24/2015  Outpatient Primary MD for the patient is  Melinda Crutch, MD  Referring MD/NP/PA: Rod Holler  Outpatient Specialists:   Patient coming from: SNF  Chief Complaint  Patient presents with  . Altered Mental Status      HPI:    Christina Summers  is a 82 y.o. female, w Dementia (Code status DNR),  Who presents with lethargy and unresponsive.  Pt was brought to ED for evaluation.   In ED,  ua => uti, and CXR => ? Pneumonia, bibasilar.  Wbc elevated 17.6, febrile,  Pt borderline hypotensive.  Pt had ARF w Creatinine 2.95  And acute hepatitis.      Review of systems:    In addition to the HPI above,   ROS : unable to obtain due to dementia  With Past History of the following :    Past Medical History  Diagnosis Date  . Insomnia, unspecified   . Hard of hearing 06/25/2012  . Stroke ([place]     tia  . Cancer ([place])     skin  . Dementia   . Dementia with behavioral disturbance 06/25/2012  . Anxiety       Past Surgical History  Procedure Laterality Date  . No past surgeries    . Excision melanoma with sentinel lymph node biopsy Left 10/07/2013    Procedure:  LEFT UPPER EXTREMITY EXCISIONAL BIOPSY;  Surgeon: Ralene Ok, MD;  Location: [place];  Service: General;  Laterality: Left;  . Intramedullary (im) nail intertrochanteric Right 09/18/2015    Procedure: INTRAMEDULLARY (IM) NAIL INTERTROCHANTRIC FEMUR FRACTURE;  Surgeon: Leandrew Koyanagi, MD;  Location: [place];  Service: Orthopedics;  Laterality: Right;      Social History:     Social History  Substance Use Topics  . Smoking status: Never Smoker   . Smokeless tobacco: Never Used  . Alcohol Use: No     Lives - at SNF  Mobility -    Family History :     Family History  Problem  Relation Age of Onset  . Other Mother   . Other Father   . Heart attack Father       Home Medications:   Prior to Admission medications   Medication Sig Start Date End Date Taking? Authorizing Provider  acetaminophen (TYLENOL) 325 MG tablet Take 2 tablets (650 mg total) by mouth every 6 (six) hours as needed for mild pain (or Fever >/= 101). 09/20/15  Yes Albertine Patricia, MD  aspirin 81 MG tablet Take 81 mg by mouth daily.   Yes Historical Provider, MD  Cholecalciferol (VITAMIN D3) 1000 units CAPS Take by mouth daily.   Yes Historical Provider, MD  clonazePAM (KLONOPIN) 0.5 MG tablet Take  1 tablet (0.5 mg total) by mouth 2 (two) times daily as needed for anxiety. 09/20/15  Yes Albertine Patricia, MD  divalproex (DEPAKOTE SPRINKLE) 125 MG capsule Take 125 mg by mouth 2 (two) times daily.   Yes Historical Provider, MD  docusate sodium (COLACE) 100 MG capsule Take 1 capsule (100 mg total) by mouth 2 (two) times daily. 09/20/15  Yes Silver Huguenin Elgergawy, MD  donepezil (ARICEPT) 10 MG tablet Take 1 tablet (10 mg total) by mouth daily. 07/29/13  Yes Dennie Bible, NP  enoxaparin (LOVENOX) 30 MG/0.3ML injection Inject 0.3 mLs (30 mg total) into the skin daily. Please take for 30 days then stop. 09/20/15 10/18/15 Yes Silver Huguenin Elgergawy, MD  HYDROcodone-acetaminophen (NORCO) 7.5-325 MG tablet Take 1-2 tablets by mouth every 6 (six) hours as needed for moderate pain. 09/18/15  Yes Leandrew Koyanagi, MD  sertraline (ZOLOFT) 50 MG tablet Take 75 mg by mouth daily.   Yes Historical Provider, MD     Allergies:    No Known Allergies   Physical Exam:   Vitals  Blood pressure 94/66, pulse 108, temperature 101.9 F (38.8 C), temperature source Rectal, resp. rate 32, height 4\' 11"  (1.499 m), weight 49.442 kg (109 lb), SpO2 100 %.   1. General  lying in bed pt doesn't respond to verbal prompting  2. Not responsive to verbal stimuli  3. No F.N deficits, ALL C.Nerves Intact, Strength 5/5 all 4  extremities, Sensation intact all 4 extremities, Plantars down going.  4. Ears and Eyes appear Normal, Conjunctivae clear, PERRLA. Mucosa dry  5. Supple Neck, No JVD, No cervical lymphadenopathy appriciated, No Carotid Bruits.  6. Symmetrical Chest wall movement, Good air movement bilaterally, slight basilar crackles   7. RRR, No Gallops, Rubs or Murmurs, No Parasternal Heave.  8. Positive Bowel Sounds, Abdomen Soft, No tenderness, No organomegaly appriciated,No rebound -guarding or rigidity.  9.  No Cyanosis, Normal Skin Turgor, No Skin Rash or Bruise.  10. Good muscle tone,  joints appear normal , no effusions, Normal ROM.  11. No Palpable Lymph Nodes in Neck or Axillae     Data Review:    CBC  Recent Labs Lab 10/08/2015 1136 09/29/2015 1242  WBC 17.6* PENDING  HGB 10.4* 10.4*  HCT 35.2* 35.3*  PLT 201 PENDING  MCV 109.7* 109.0*  MCH 32.4 32.1  MCHC 29.5* 29.5*  RDW 20.2* 20.3*  LYMPHSABS 0.9 PENDING  MONOABS 0.9 PENDING  EOSABS 0.0 PENDING  BASOSABS 0.2* PENDING   ------------------------------------------------------------------------------------------------------------------  Chemistries   Recent Labs Lab 10/23/2015 1045  NA 161*  K 4.6  CL 124*  CO2 22  GLUCOSE 231*  BUN 135*  CREATININE 2.93*  CALCIUM 8.0*  AST 1670*  ALT 1250*  ALKPHOS 101  BILITOT 3.3*   ------------------------------------------------------------------------------------------------------------------ estimated creatinine clearance is 8.9 mL/min (by C-G formula based on Cr of 2.93). ------------------------------------------------------------------------------------------------------------------ No results for input(s): TSH, T4TOTAL, T3FREE, THYROIDAB in the last 72 hours.  Invalid input(s): FREET3  Coagulation profile No results for input(s): INR, PROTIME in the last 168  hours. ------------------------------------------------------------------------------------------------------------------- No results for input(s): DDIMER in the last 72 hours. -------------------------------------------------------------------------------------------------------------------  Cardiac Enzymes No results for input(s): CKMB, TROPONINI, MYOGLOBIN in the last 168 hours.  Invalid input(s): CK ------------------------------------------------------------------------------------------------------------------ No results found for: BNP   ---------------------------------------------------------------------------------------------------------------  Urinalysis    Component Value Date/Time   COLORURINE ORANGE* 10/24/2015 1048   APPEARANCEUR TURBID* 10/09/2015 1048   LABSPEC 1.023 10/09/2015 1048   PHURINE 5.5 09/27/2015 1048   GLUCOSEU  NEGATIVE 10/21/2015 1048   HGBUR MODERATE* 10/15/2015 1048   BILIRUBINUR SMALL* 10/09/2015 1048   KETONESUR 15* 09/26/2015 1048   PROTEINUR NEGATIVE 10/02/2015 1048   UROBILINOGEN 1.0 01/17/2014 1950   NITRITE NEGATIVE 10/16/2015 1048   LEUKOCYTESUR MODERATE* 10/04/2015 1048    ----------------------------------------------------------------------------------------------------------------   Imaging Results:    Dg Chest Port 1 View  10/12/2015  CLINICAL DATA:  Fever.  Decreased mental status. EXAM: PORTABLE CHEST 1 VIEW COMPARISON:  09/17/2015 FINDINGS: Mild cardiomegaly stable.  Aortic atherosclerosis noted. New pulmonary opacity is seen in both lung bases. No evidence of pneumothorax or pleural effusion. Old left-sided rib fracture deformities again noted. IMPRESSION: New bibasilar pulmonary opacity, which may be due to pneumonia or atelectasis. Stable cardiomegaly.  Aortic atherosclerosis noted. Electronically Signed   By: Earle Gell M.D.   On: 10/10/2015 10:54       Assessment & Plan:    Active Problems:   UTI (lower urinary tract  infection)   Acute renal failure (ARF) (HCC)   Abnormal liver function   Hypernatremia   Diabetes (HCC)   Anemia    1. Uti Family not certain if they would like to continue abx Zosyn iv pharmacy to dose for now,  If CMO then can d/c   2. ARF Hydrate with ns iv  3. Hypernatremia Hydrate with ns iv Check cmp in am  4.  Hypotension Improving with fluid  5.  Abnormal liver function  Check acute hepatitis panel Check tylenol level Check cmp  6. Anemia Check cbc in am  7. Fever secondary to #1 ? Pneumonia, will cover with levaquin for now as well and vanco iv 1 dose.    Case discussed with family and they are aware that prognosis poor.     DVT Prophylaxis Heparin   SCDs   AM Labs Ordered, also please review Full Orders  Family Communication: Admission, patients condition and plan of care including tests being ordered have been discussed with the patient family who indicate understanding and agree with the plan and Code Status.  Code Status DNR  Likely DC to  ? Hospice   Condition CRITICAL    Consults called: palliative care  Admission status: inpatient  Time spent in minutes : 45 minutes   Jani Gravel M.D on 09/26/2015 at 1:08 PM  Between 7am to 7pm - Pager - [phone]. After 7pm go to [email or web address] - password [facility]  [facility] - Office  [phone]

## 2015-09-27 NOTE — Progress Notes (Signed)
Patient ID: Christina Summers, female   DOB: Aug 07, 1924, 82 y.o.   MRN: [id number]   I discussed with her son,  CMO He just wants her kept comfortable.  Will discontinue all lab draw, iv fluid and abx Family aware of poor prognosis

## 2015-09-27 NOTE — Progress Notes (Signed)
Pharmacy Antibiotic Note  Christina Summers is a 82 y.o. female brought in to the [facility] from a NH on 09/26/2015 after being found unresponsive by the staff. The patient is noted to have had recent hip surgery.Pharmacy has been consulted to start Vancomycin + Zosyn for r/o sepsis.   Vanc 1g IV x 1 and Zosyn 3.375g IV x 1 already ordered by the EDP. SCr 2.93, CrCl<10 ml/min.  Code sepsis called at 68. Pharmacy arrived around 1100 and antibiotics had already been administered by the RN.   Plan: 1. Vancomycin 1g IV x 1 to load (already ordered by the EDP and given by the RN) 2. No standing doses of Vancomycin for now - trend renal function 3. Zosyn 3.375g IV x 1 dose now to load (already ordered by the EDP and given by the RN) then start 2.25g IV every 8 hours 4. Will continue to follow renal function, culture results, LOT, and antibiotic de-escalation plans      Temp (24hrs), Avg:104.4 F (40.2 C), Min:104.4 F (40.2 C), Max:104.4 F (40.2 C)  No results for input(s): WBC, CREATININE, LATICACIDVEN, VANCOTROUGH, VANCOPEAK, VANCORANDOM, GENTTROUGH, GENTPEAK, GENTRANDOM, TOBRATROUGH, TOBRAPEAK, TOBRARND, AMIKACINPEAK, AMIKACINTROU, AMIKACIN in the last 168 hours.  Estimated Creatinine Clearance: 32.5 mL/min (by C-G formula based on Cr of 0.58).    No Known Allergies  Antimicrobials this admission: Vanc 7/2 >> Zosyn 7/2 >>  Dose adjustments this admission:   Microbiology results: 7/2 BCx >> 7/2 UCx >>  Thank you for allowing pharmacy to be a part of this patient's care.  Alycia Rossetti, PharmD, BCPS Clinical Pharmacist Pager: [phone] 09/27/2015 11:57 AM

## 2015-09-27 NOTE — ED Notes (Signed)
Per ems- Pt comes from [facility] home was [facility] at 830 pm last night. This morning was found unresponsive by staff. At baseline pt follows commands. BP 100/68, HR 121, CBG 212, RR 38, 86% RA. Right nare NPA placed. Pt has a DNR. At nursing home for recent hip surgery. Pt is unresponsive but withdraws from pain.

## 2015-09-27 NOTE — ED Notes (Signed)
Attempted to contact family without success.

## 2015-09-27 NOTE — ED Notes (Signed)
Dr. Maudie Mercury reports no more labs or IV fluids.

## 2015-09-28 DIAGNOSIS — N390 Urinary tract infection, site not specified: Secondary | ICD-10-CM

## 2015-09-28 LAB — BLOOD CULTURE ID PANEL (REFLEXED)
ACINETOBACTER BAUMANNII: NOT DETECTED
Acinetobacter baumannii: NOT DETECTED
CANDIDA ALBICANS: NOT DETECTED
CANDIDA GLABRATA: NOT DETECTED
CANDIDA GLABRATA: NOT DETECTED
CANDIDA PARAPSILOSIS: NOT DETECTED
CANDIDA TROPICALIS: NOT DETECTED
CANDIDA TROPICALIS: NOT DETECTED
CARBAPENEM RESISTANCE: NOT DETECTED
Candida albicans: NOT DETECTED
Candida krusei: NOT DETECTED
Candida krusei: NOT DETECTED
Candida parapsilosis: NOT DETECTED
Carbapenem resistance: NOT DETECTED
ENTEROBACTER CLOACAE COMPLEX: NOT DETECTED
ENTEROBACTERIACEAE SPECIES: NOT DETECTED
ENTEROCOCCUS SPECIES: NOT DETECTED
ESCHERICHIA COLI: DETECTED — AB
ESCHERICHIA COLI: NOT DETECTED
Enterobacter cloacae complex: NOT DETECTED
Enterobacteriaceae species: DETECTED — AB
Enterococcus species: NOT DETECTED
HAEMOPHILUS INFLUENZAE: NOT DETECTED
Haemophilus influenzae: NOT DETECTED
KLEBSIELLA PNEUMONIAE: NOT DETECTED
Klebsiella oxytoca: NOT DETECTED
Klebsiella oxytoca: NOT DETECTED
Klebsiella pneumoniae: NOT DETECTED
LISTERIA MONOCYTOGENES: NOT DETECTED
Listeria monocytogenes: NOT DETECTED
METHICILLIN RESISTANCE: DETECTED — AB
METHICILLIN RESISTANCE: NOT DETECTED
NEISSERIA MENINGITIDIS: NOT DETECTED
Neisseria meningitidis: NOT DETECTED
PROTEUS SPECIES: NOT DETECTED
PROTEUS SPECIES: NOT DETECTED
PSEUDOMONAS AERUGINOSA: NOT DETECTED
Pseudomonas aeruginosa: NOT DETECTED
SERRATIA MARCESCENS: NOT DETECTED
STAPHYLOCOCCUS AUREUS BCID: NOT DETECTED
STAPHYLOCOCCUS AUREUS BCID: NOT DETECTED
STAPHYLOCOCCUS SPECIES: NOT DETECTED
STREPTOCOCCUS AGALACTIAE: NOT DETECTED
STREPTOCOCCUS PNEUMONIAE: NOT DETECTED
STREPTOCOCCUS PYOGENES: NOT DETECTED
STREPTOCOCCUS SPECIES: NOT DETECTED
Serratia marcescens: NOT DETECTED
Staphylococcus species: DETECTED — AB
Streptococcus agalactiae: NOT DETECTED
Streptococcus pneumoniae: NOT DETECTED
Streptococcus pyogenes: NOT DETECTED
Streptococcus species: NOT DETECTED
VANCOMYCIN RESISTANCE: NOT DETECTED
Vancomycin resistance: NOT DETECTED

## 2015-09-29 LAB — URINE CULTURE

## 2015-09-30 LAB — CULTURE, BLOOD (ROUTINE X 2)

## 2015-10-08 ENCOUNTER — Ambulatory Visit: Payer: Medicare Other | Admitting: Podiatry

## 2015-10-27 NOTE — Progress Notes (Signed)
Patient was admitted on 10/24/2015 with an order for palliative care consult and all medications discontinued. She was started on O2 via a non-re breather and scopolamine patch for secretions. Patient continue to have labored breathing through this shift of which family is aware. At 4:55 am, patient passed peacefully, MD Alcario Drought pronounced death, Son Mr, Sewall and daughter-in-law Evelen Janiak were notify, they were receptive to information. MD and family decline autopsy. MD added, this is not medical examiners case.

## 2015-10-27 NOTE — Progress Notes (Signed)
PHARMACY - PHYSICIAN COMMUNICATION CRITICAL VALUE ALERT - BLOOD CULTURE IDENTIFICATION (BCID)  Results for orders placed or performed during the hospital encounter of 10/19/2015  Blood Culture ID Panel (Reflexed) (Collected: 10/07/2015 10:45 AM)  Result Value Ref Range   Enterococcus species NOT DETECTED NOT DETECTED   Vancomycin resistance NOT DETECTED NOT DETECTED   Listeria monocytogenes NOT DETECTED NOT DETECTED   Staphylococcus species NOT DETECTED NOT DETECTED   Staphylococcus aureus NOT DETECTED NOT DETECTED   Methicillin resistance NOT DETECTED NOT DETECTED   Streptococcus species NOT DETECTED NOT DETECTED   Streptococcus agalactiae NOT DETECTED NOT DETECTED   Streptococcus pneumoniae NOT DETECTED NOT DETECTED   Streptococcus pyogenes NOT DETECTED NOT DETECTED   Acinetobacter baumannii NOT DETECTED NOT DETECTED   Enterobacteriaceae species DETECTED (A) NOT DETECTED   Enterobacter cloacae complex NOT DETECTED NOT DETECTED   Escherichia coli DETECTED (A) NOT DETECTED   Klebsiella oxytoca NOT DETECTED NOT DETECTED   Klebsiella pneumoniae NOT DETECTED NOT DETECTED   Proteus species NOT DETECTED NOT DETECTED   Serratia marcescens NOT DETECTED NOT DETECTED   Carbapenem resistance NOT DETECTED NOT DETECTED   Haemophilus influenzae NOT DETECTED NOT DETECTED   Neisseria meningitidis NOT DETECTED NOT DETECTED   Pseudomonas aeruginosa NOT DETECTED NOT DETECTED   Candida albicans NOT DETECTED NOT DETECTED   Candida glabrata NOT DETECTED NOT DETECTED   Candida krusei NOT DETECTED NOT DETECTED   Candida parapsilosis NOT DETECTED NOT DETECTED   Candida tropicalis NOT DETECTED NOT DETECTED    Name of physician (or Provider) Contacted: L Harduk, PAC  Changes to prescribed antibiotics required: Pt on comfort care and all treatments have been d/c'd including abx.  Wynona Neat, PharmD, BCPS  10/21/15  4:06 AM

## 2015-10-27 NOTE — Significant Event (Signed)
Time of death is 4:55.  Summoned to floor by RN to confirm death and sign death certificate.  This is a patient who is comfort measures only (See Dr. Julianne Rice last note) and for whom inpatient death was anticipated.  Patient with no heart sounds, no pulse, placed patient on tele monitor to confirm asystole.  Death certificate filled out.  Cause of death is septic shock due to e. Coli bacteremia due to UTI.  All of this is occuring in a patient who just suffered a hip fracture requiring operative repair 10 days ago.  RN has informed patient's family.

## 2015-10-27 DEATH — deceased

## 2015-11-27 NOTE — Discharge Summary (Signed)
Death Summary      Christina Summers [id number] DOB: June 24, 1924 DOA: 10-05-15  PCP:  Melinda Crutch, MD  Admit date: 10-05-2015 Date of Death: 10-06-15   Final Diagnoses:   Principle Problem:    Sepsis/septic shock secondary to healthcare associated pneumonia and UTI (Enterobacter and E. Coli bacteremia)  Active Problems:    UTI (lower urinary tract infection)    Acute renal failure (ARF) (HCC)    Abnormal liver function    Hypernatremia    Diabetes (HCC)    Anemia    Acute kidney injury    STEMI    Hyperglycemia    Acute hepatitis    Aortic atherosclerosis    Abnormal EKG  History of Present Illness:   The patient was an 82 year old woman with a PMH of dementia, recent hip fracture and prior stroke who was sent to the ED from her SNF for evaluation of lethargy/unresponsiveness. Upon initial evaluation in the ED she was noted to be hypotensive with leukocytosis and fever.  Creatinine was 2.95 on admission, consistent with AKI and the patient's lactic acid level was 3.81 consistent with sepsis. Troponin was elevated at 5.75. U/A showed many bacteria.  LFTs were markedly elevated. CXR showed a new bibasilar pulmonary opacity, ? Pneumonia versus atelectasis.   Hospital Course:   The patient was admitted with an order for palliative care consult. A discussion was held with the son by the patient's admitting MD and he decided to pursue comfort measures only.  She was started on O2 via a non-re breather and scopolamine patch for secretions. All medications, IVF and antibiotics were discontinues.  No further lab draws were obtained in light of the patient's sons wishes.  Patient had labored breathing and family was aware of her poor prognosis. At 4:55 am, patient passed peacefully.  Time of death: 4:55 a.m.  Signed:  RAMA,CHRISTINA  [facility] 11/23/2015, 3:43 PM

## 2015-11-27 NOTE — Discharge Summary (Signed)
  Pt admitted for sepsis secondary to uti ? Pneumonia.  Pt also had ARF, and hepatitis.  Pt was made CMO by family and passed secondary to sepsis.

## 2016-02-09 IMAGING — CT CT HEAD W/O CM
1 series · 15 of 30 positions shown, 19 images · non-contrast
Comparison: Head CT July 16, 2012

CLINICAL DATA: Status post fall today with right lateral orbital
laceration.

EXAM:
CT HEAD WITHOUT CONTRAST
CT MAXILLOFACIAL WITHOUT CONTRAST
TECHNIQUE: Multidetector CT imaging of the head and maxillofacial structures
were performed using the standard protocol without intravenous
contrast. Multiplanar CT image reconstructions of the maxillofacial
structures were also generated.

[Series 2: head 4.8 h37s · axial · 0.42mm/px · z∈[-172,-39]mm · 15 of 32 slices shown, 19 images]
[im 2/32  brain]
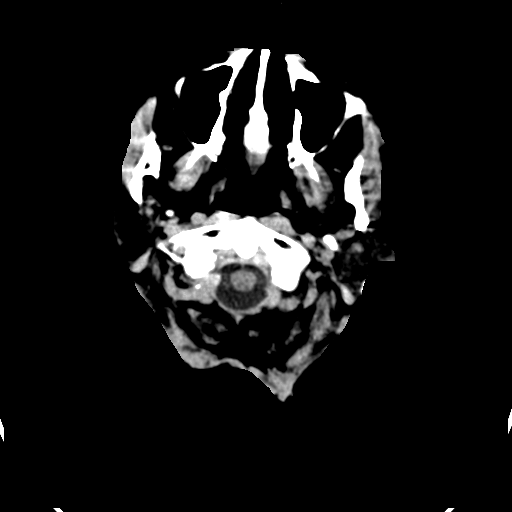
[im 2/32  bone]
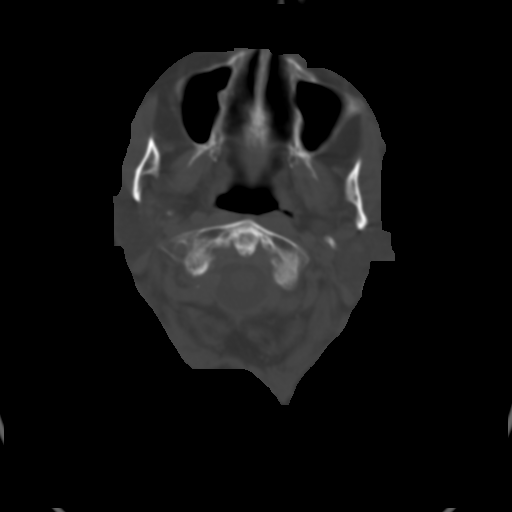
[im 4/32  brain]
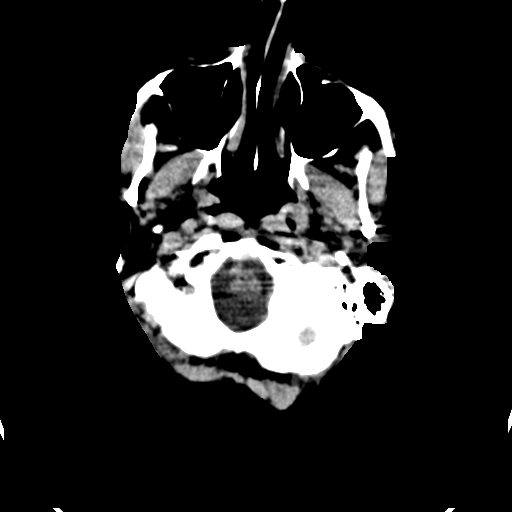
[im 6/32  brain]
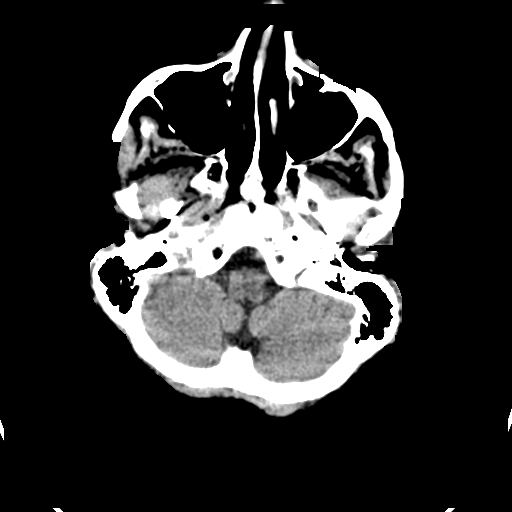
[im 8/32  brain]
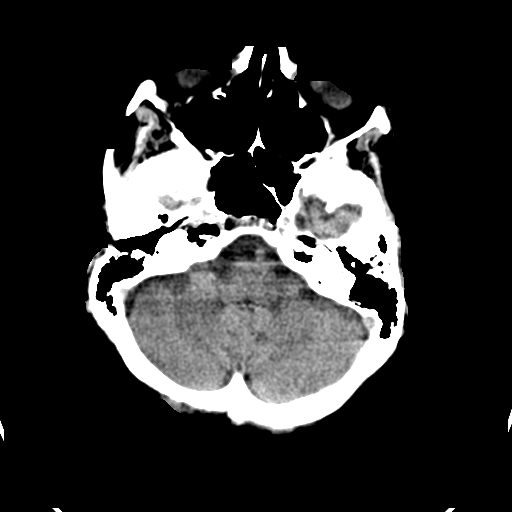
[im 10/32  brain]
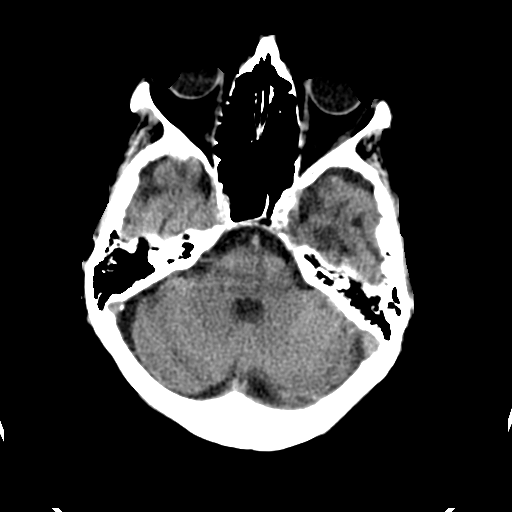
[im 10/32  bone]
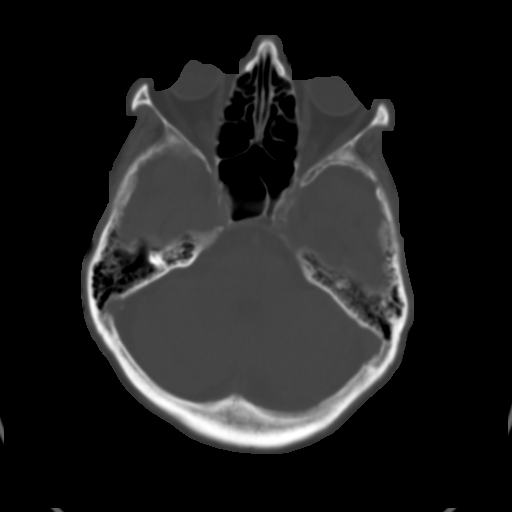
[im 12/32  brain]
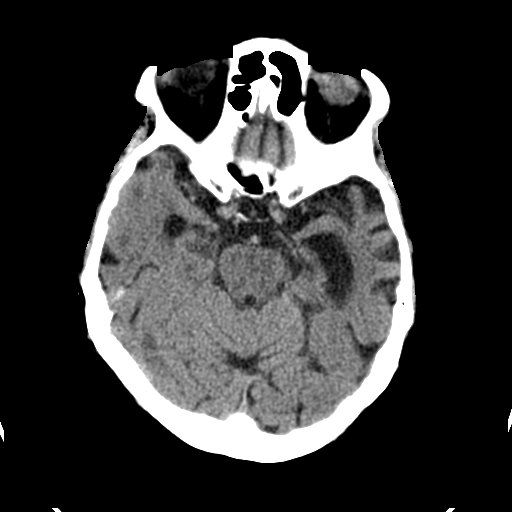
[im 14/32  brain]
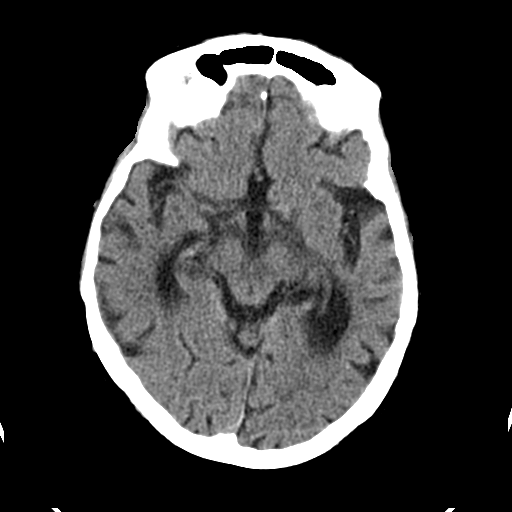
[im 17/32  brain]
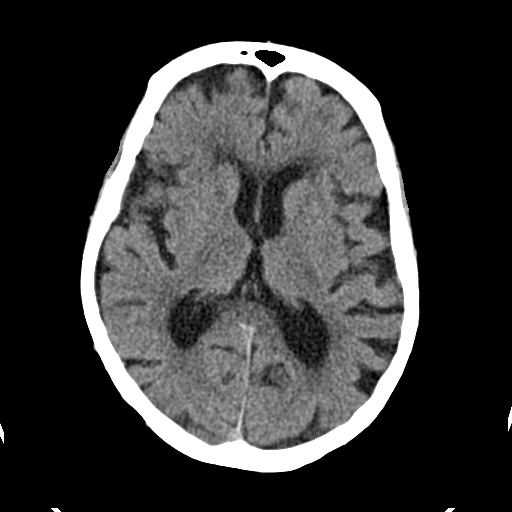
[im 18/32  brain]
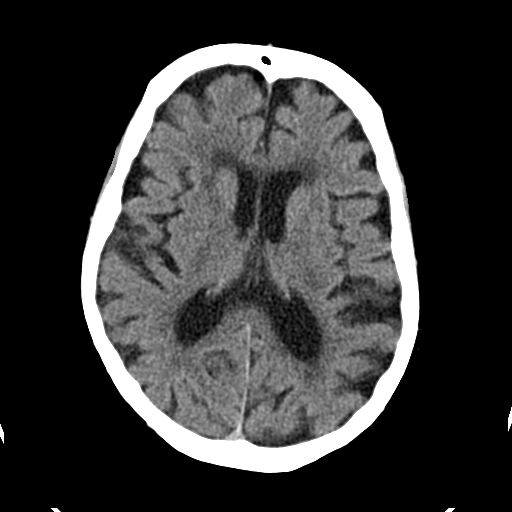
[im 18/32  bone]
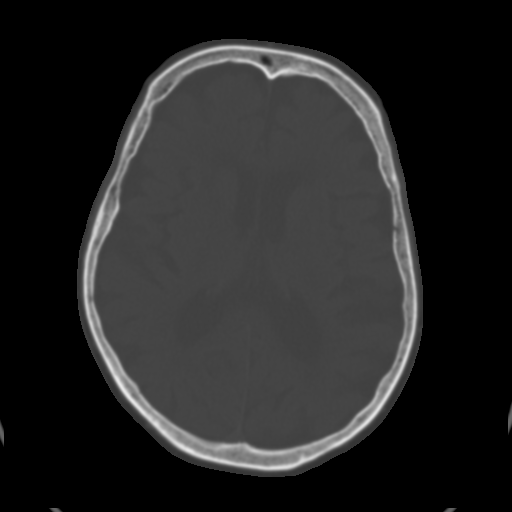
[im 20/32  brain]
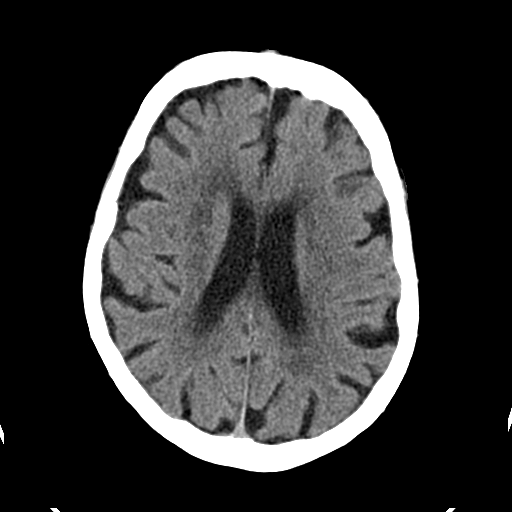
[im 22/32  brain]
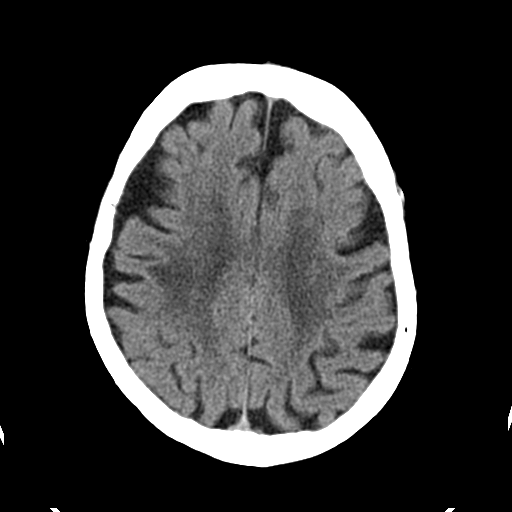
[im 24/32  brain]
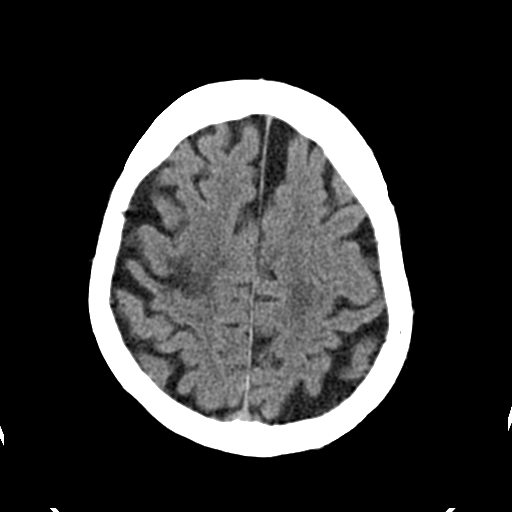
[im 26/32  brain]
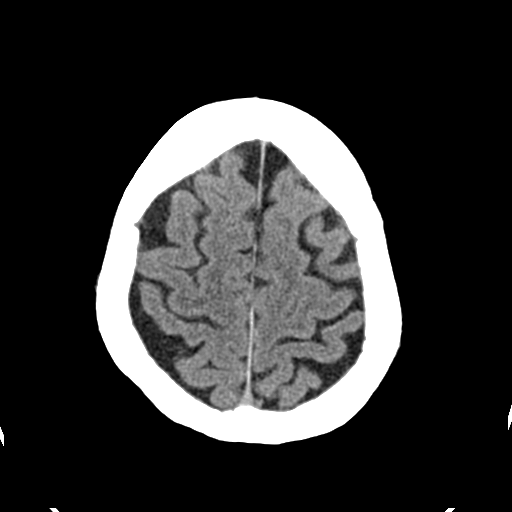
[im 26/32  bone]
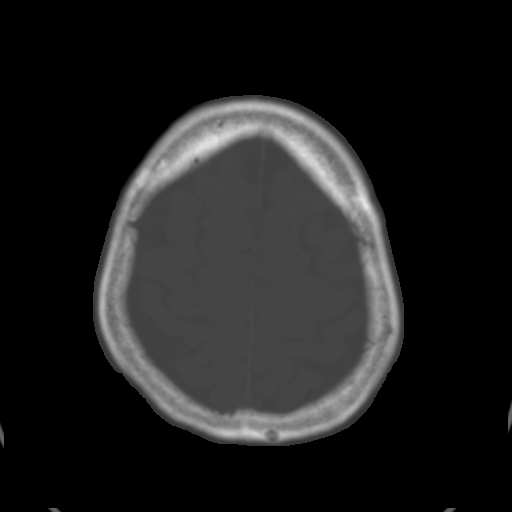
[im 28/32  brain]
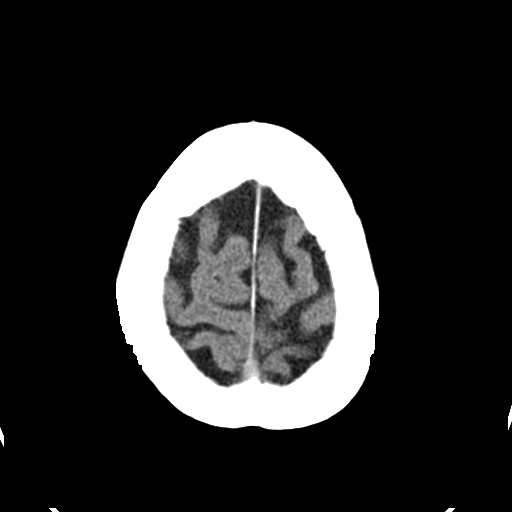
[im 30/32  brain]
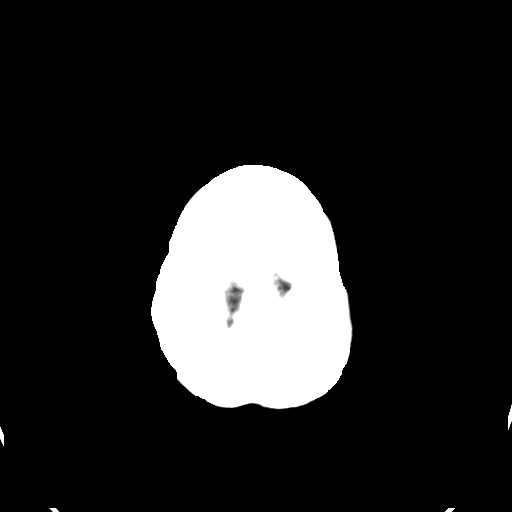

[15 of 30 positions shown; findings below may reference images not displayed]

FINDINGS: CT HEAD FINDINGS

There is chronic diffuse atrophy. Chronic bilateral periventricular
white matter small vessel ischemic changes identified. Chronic
hypodensity is identified in the posterior right frontal lobe
unchanged compared to prior exam consistent with old infarct. There
is no midline shift or hydrocephalus. No acute hemorrhage or acute
transcortical infarct is identified. The bony calvarium is intact.
The visualized sinuses are clear.

CT MAXILLOFACIAL FINDINGS

There is no acute fracture or dislocation. The visualized sinuses
are clear. The bilateral orbits are normal. The soft tissues are
normal.
IMPRESSION: No focal acute intracranial abnormality identified. Chronic diffuse
atrophy. Chronic bilateral periventricular white matter small vessel
ischemic change.

No acute fracture dislocation of maxillofacial bones. The right
globe and orbit are normal.
